# Patient Record
Sex: Female | Born: 1975 | Race: Black or African American | Hispanic: No | Marital: Single | Smoking: Former smoker
Health system: Southern US, Community
[De-identification: ages and names within clinical notes are randomized; demographics above are authoritative.]

## PROBLEM LIST (undated history)

## (undated) DIAGNOSIS — G43409 Hemiplegic migraine, not intractable, without status migrainosus: Secondary | ICD-10-CM

## (undated) DIAGNOSIS — F419 Anxiety disorder, unspecified: Secondary | ICD-10-CM

## (undated) DIAGNOSIS — A6000 Herpesviral infection of urogenital system, unspecified: Secondary | ICD-10-CM

## (undated) DIAGNOSIS — F32A Depression, unspecified: Secondary | ICD-10-CM

## (undated) DIAGNOSIS — F329 Major depressive disorder, single episode, unspecified: Secondary | ICD-10-CM

## (undated) DIAGNOSIS — A64 Unspecified sexually transmitted disease: Secondary | ICD-10-CM

## (undated) DIAGNOSIS — A5901 Trichomonal vulvovaginitis: Secondary | ICD-10-CM

## (undated) DIAGNOSIS — I10 Essential (primary) hypertension: Secondary | ICD-10-CM

## (undated) HISTORY — PX: TUBAL LIGATION: SHX77

## (undated) HISTORY — DX: Anxiety disorder, unspecified: F41.9

## (undated) HISTORY — DX: Depression, unspecified: F32.A

## (undated) HISTORY — DX: Herpesviral infection of urogenital system, unspecified: A60.00

## (undated) HISTORY — DX: Hemiplegic migraine, not intractable, without status migrainosus: G43.409

## (undated) HISTORY — DX: Major depressive disorder, single episode, unspecified: F32.9

## (undated) HISTORY — DX: Unspecified sexually transmitted disease: A64

## (undated) HISTORY — PX: MOUTH SURGERY: SHX715

## (undated) HISTORY — DX: Trichomonal vulvovaginitis: A59.01

---

## 2015-08-08 ENCOUNTER — Encounter: Payer: Self-pay | Admitting: Obstetrics & Gynecology

## 2015-08-08 ENCOUNTER — Ambulatory Visit (INDEPENDENT_AMBULATORY_CARE_PROVIDER_SITE_OTHER): Payer: Medicaid Other | Admitting: Obstetrics & Gynecology

## 2015-08-08 VITALS — BP 131/75 | HR 70 | Ht 67.0 in | Wt 195.8 lb

## 2015-08-08 DIAGNOSIS — Z3009 Encounter for other general counseling and advice on contraception: Secondary | ICD-10-CM

## 2015-08-08 NOTE — Progress Notes (Signed)
   CLINIC ENCOUNTER NOTE  History:  39 y.o. [id number] (SVD x 3) here today for sterilization consult. She denies any abnormal vaginal discharge, bleeding, pelvic pain or other concerns.   History reviewed. No pertinent past medical history.  History reviewed. No pertinent past surgical history.  The following portions of the patient's history were reviewed and updated as appropriate: allergies, current medications, past family history, past medical history, past social history, past surgical history and problem list.   Review of Systems:  Pertinent items noted in HPI and remainder of comprehensive ROS otherwise negative.  Objective:  Physical Exam BP 131/75 mmHg  Pulse 70  Ht 5\' 7"  (1.702 m)  Wt 195 lb 12.8 oz (88.814 kg)  BMI 30.66 kg/m2  LMP 08/07/2015 (Exact Date) CONSTITUTIONAL: Well-developed, well-nourished female in no acute distress.  HENT:  Normocephalic, atraumatic. External right and left ear normal. Oropharynx is clear and moist EYES: Conjunctivae and EOM are normal. Pupils are equal, round, and reactive to light. No scleral icterus.  NECK: Normal range of motion, supple, no masses SKIN: Skin is warm and dry. No rash noted. Not diaphoretic. No erythema. No pallor. NEUROLGIC: Alert and oriented to person, place, and time. Normal reflexes, muscle tone coordination. No cranial nerve deficit noted. PSYCHIATRIC: Normal mood and affect. Normal behavior. Normal judgment and thought content. CARDIOVASCULAR: Normal heart rate noted RESPIRATORY: Effort and breath sounds normal, no problems with respiration noted ABDOMEN: Soft, no distention noted.   PELVIC: Deferred MUSCULOSKELETAL: Normal range of motion. No edema noted.    Assessment & Plan:  Consultation for female sterilization Patient desires permanent sterilization.  Other reversible forms of contraception (over the counter/barrier methods; hormonal contraceptives including pill, patch, ring, Depo-Provera injection,  Nexplanon implant; hormonal IUDs Skyla and Mirena; nonhormonal copper IUD Paragard) were discussed with patient; she declined all these modalities. Also discussed the option of vasectomy for her female partner; she also declined this option. Recommended laparoscopic bilateral tubal sterilization using Filshie clips.  Failure risk of 1-2 % with increased risk of ectopic gestation if pregnancy occurs was also discussed with patient.  Reiterated permanence and irreversibility of this procedure, attempts to reverse tubal sterilization are often not successful.  Also emphasized risk of regret which is noted more in patients less than the age of 67[age].  Other risks of the procedure were discussed with patient including but not limited to: bleeding, infection, injury to surrounding organs and need for additional procedures.  All questions were answered.  Patient verbalized understanding of these risks and wants to proceed with this procedure.  She was told that she will be contacted by our surgical scheduler regarding the time and date of her surgery; routine preoperative instructions of having nothing to eat or drink after midnight on the day prior to surgery and also coming to the hospital 1.5 hours prior to her time of surgery were also emphasized.  She was told she may be called for a preoperative appointment about a week prior to surgery and will be given further preoperative instructions at that visit. Printed patient education handouts about the procedure were given to the patient to review at home. Medicaid papers were signed today.   Total face-to-face time with patient: 20  minutes. Over 50% of encounter was spent on counseling and coordination of care.   Jaynie CollinsUGONNA  Raelie Lohr, MD, FACOG Attending Obstetrician & Gynecologist, [facility]

## 2015-08-08 NOTE — Patient Instructions (Signed)
Laparoscopic Tubal Ligation Laparoscopic tubal ligation is a procedure that closes the fallopian tubes at a time other than right after childbirth. When the fallopian tubes are closed, the eggs that are released from the ovaries cannot enter the uterus, and sperm cannot reach the egg. Tubal ligation is also known as getting your "tubes tied." Tubal ligation is done so you will not be able to get pregnant or have a baby. Although this procedure may be undone (reversed), it should be considered permanent and irreversible. If you want to have future pregnancies, you should not have this procedure. LET YOUR HEALTH CARE PROVIDER KNOW ABOUT:  Any allergies you have.  All medicines you are taking, including vitamins, herbs, eye drops, creams, and over-the-counter medicines. This includes any use of steroids, either by mouth or in cream form.  Previous problems you or members of your family have had with the use of anesthetics.  Any blood disorders you have.  Previous surgeries you have had.  Any medical conditions you may have.  Possibility of pregnancy, if this applies.  Any past pregnancies. RISKS AND COMPLICATIONS  Infection.  Bleeding.  Injury to surrounding organs.  Side effects from anesthetics.  Failure of the procedure.  Ectopic pregnancy.  Future regret about having the procedure done. BEFORE THE PROCEDURE  Ask your health care provider about:  Changing or stopping your regular medicines. This is especially important if you are taking diabetes medicines or blood thinners.  Taking medicines such as aspirin and ibuprofen. These medicines can thin your blood. Do not take these medicines before your procedure if your health care provider instructs you not to.  Follow instructions from your health care provider about eating and drinking restrictions.  Plan to have someone take you home after the procedure.  If you go home right after the procedure, plan to have someone  with you for 24 hours. PROCEDURE  You will be given one or more of the following:  A medicine that helps you relax (sedative).  A medicine that numbs the area (local anesthetic).  A medicine that makes you fall asleep (general anesthetic).  A medicine that is injected into an area of your body that numbs everything below the injection site (regional anesthetic).  If you have been given general anesthetic, a tube will be put down your throat to help you breathe.  Two small cuts (incisions) will be made in the lower abdominal area and near the belly button.  Your bladder may be emptied with a small tube (catheter).  Your abdomen will be inflated with a safe gas (carbon dioxide). This will help to give the surgeon room to operate and visualize, and it will help the surgeon to avoid other organs.  A thin, lighted tube (laparoscope) with a camera attached will be inserted into your abdomen through one of the incisions near the belly button. Other small instruments will be inserted through the other abdominal incision.  The fallopian tubes will be tied off or burned (cauterized), or they will be blocked with a clip, ring, or clamp. In many cases, a small portion in the center of each fallopian tube will also be removed.  After the fallopian tubes are blocked, the gas will be released from the abdomen.  The incisions will be closed with stitches (sutures).  A bandage (dressing) will be placed over the incisions. The procedure may vary among health care providers and hospitals. AFTER THE PROCEDURE  Your blood pressure, heart rate, breathing rate, and blood oxygen level   will be monitored often until the medicines you were given have worn off.  You will be given pain medicine as needed.  If you had general anesthetic, you may have some mild discomfort in your throat. This is from the breathing tube that was placed in your throat while you were sleeping.  You may experience discomfort in  the shoulder area from some trapped air between your liver and your diaphragm. This sensation is normal, and it will slowly go away on its own.  You will have some mild abdominal discomfort for 3--7 days.   This information is not intended to replace advice given to you by your health care provider. Make sure you discuss any questions you have with your health care provider.   Document Released: 11/24/2000 Document Revised: 01/02/2015 Document Reviewed: 11/29/2011 Elsevier Interactive Patient Education 2016 [employer]  

## 2015-08-09 ENCOUNTER — Encounter (HOSPITAL_COMMUNITY): Payer: Self-pay | Admitting: *Deleted

## 2015-08-09 ENCOUNTER — Encounter: Payer: Self-pay | Admitting: *Deleted

## 2015-09-13 ENCOUNTER — Encounter (HOSPITAL_COMMUNITY): Payer: Self-pay

## 2015-09-27 ENCOUNTER — Encounter (HOSPITAL_COMMUNITY)
Admission: RE | Disposition: A | Discharge status: Home / Self Care | Payer: Self-pay | Source: Ambulatory Visit | Attending: Obstetrics & Gynecology

## 2015-09-27 ENCOUNTER — Ambulatory Visit (HOSPITAL_COMMUNITY): Payer: Medicaid Other | Admitting: Anesthesiology

## 2015-09-27 ENCOUNTER — Ambulatory Visit (HOSPITAL_COMMUNITY)
Admission: RE | Admit: 2015-09-27 | Discharge: 2015-09-27 | Disposition: A | Discharge status: Home / Self Care | Payer: Medicaid Other | Source: Ambulatory Visit | Attending: Obstetrics & Gynecology | Admitting: Obstetrics & Gynecology

## 2015-09-27 ENCOUNTER — Encounter (HOSPITAL_COMMUNITY): Payer: Self-pay | Admitting: Obstetrics & Gynecology

## 2015-09-27 DIAGNOSIS — Z302 Encounter for sterilization: Secondary | ICD-10-CM | POA: Diagnosis not present

## 2015-09-27 HISTORY — PX: LAPAROSCOPIC TUBAL LIGATION: SHX1937

## 2015-09-27 HISTORY — DX: Essential (primary) hypertension: I10

## 2015-09-27 LAB — CBC
HEMATOCRIT: 37.9 % (ref 36.0–46.0)
HEMOGLOBIN: 12.8 g/dL (ref 12.0–15.0)
MCH: 31.4 pg (ref 26.0–34.0)
MCHC: 33.8 g/dL (ref 30.0–36.0)
MCV: 92.9 fL (ref 78.0–100.0)
Platelets: 274 10*3/uL (ref 150–400)
RBC: 4.08 MIL/uL (ref 3.87–5.11)
RDW: 13.4 % (ref 11.5–15.5)
WBC: 8 10*3/uL (ref 4.0–10.5)

## 2015-09-27 LAB — PREGNANCY, URINE: Preg Test, Ur: NEGATIVE

## 2015-09-27 SURGERY — LIGATION, FALLOPIAN TUBE, LAPAROSCOPIC
Anesthesia: General | Site: Abdomen | Laterality: Bilateral

## 2015-09-27 MED ORDER — LIDOCAINE HCL (CARDIAC) 20 MG/ML IV SOLN
INTRAVENOUS | Status: DC | PRN
  Administered 2015-09-27: 100 mg via INTRAVENOUS

## 2015-09-27 MED ORDER — BUPIVACAINE HCL (PF) 0.5 % IJ SOLN
INTRAMUSCULAR | Status: DC | PRN
  Administered 2015-09-27: 20 mL
  Administered 2015-09-27: 10 mL

## 2015-09-27 MED ORDER — LACTATED RINGERS IV SOLN
INTRAVENOUS | Status: DC
  Administered 2015-09-27 (×2): via INTRAVENOUS

## 2015-09-27 MED ORDER — NEOSTIGMINE METHYLSULFATE 10 MG/10ML IV SOLN
INTRAVENOUS | Status: AC
  Filled 2015-09-27: qty 1

## 2015-09-27 MED ORDER — MIDAZOLAM HCL 2 MG/2ML IJ SOLN
INTRAMUSCULAR | Status: DC | PRN
  Administered 2015-09-27: 2 mg via INTRAVENOUS

## 2015-09-27 MED ORDER — KETOROLAC TROMETHAMINE 30 MG/ML IJ SOLN
INTRAMUSCULAR | Status: DC | PRN
  Administered 2015-09-27: 30 mg via INTRAVENOUS

## 2015-09-27 MED ORDER — PROPOFOL 10 MG/ML IV BOLUS
INTRAVENOUS | Status: DC | PRN
  Administered 2015-09-27: 180 mg via INTRAVENOUS

## 2015-09-27 MED ORDER — FENTANYL CITRATE (PF) 100 MCG/2ML IJ SOLN
INTRAMUSCULAR | Status: DC | PRN
  Administered 2015-09-27: 100 ug via INTRAVENOUS

## 2015-09-27 MED ORDER — NEOSTIGMINE METHYLSULFATE 10 MG/10ML IV SOLN
INTRAVENOUS | Status: DC | PRN
  Administered 2015-09-27: 3 mg via INTRAVENOUS

## 2015-09-27 MED ORDER — CITRIC ACID-SODIUM CITRATE 334-500 MG/5ML PO SOLN
30.0000 mL | ORAL | Status: AC
  Administered 2015-09-27: 30 mL via ORAL

## 2015-09-27 MED ORDER — GLYCOPYRROLATE 0.2 MG/ML IJ SOLN
INTRAMUSCULAR | Status: DC | PRN
  Administered 2015-09-27: 0.4 mg via INTRAVENOUS

## 2015-09-27 MED ORDER — ACETAMINOPHEN 325 MG PO TABS: 325.0000 mg | ORAL_TABLET | ORAL | Status: DC | PRN

## 2015-09-27 MED ORDER — SCOPOLAMINE 1 MG/3DAYS TD PT72
MEDICATED_PATCH | TRANSDERMAL | Status: AC
  Administered 2015-09-27: 1.5 mg via TRANSDERMAL
  Filled 2015-09-27: qty 1

## 2015-09-27 MED ORDER — GLYCOPYRROLATE 0.2 MG/ML IJ SOLN
INTRAMUSCULAR | Status: AC
  Filled 2015-09-27: qty 3

## 2015-09-27 MED ORDER — FENTANYL CITRATE (PF) 250 MCG/5ML IJ SOLN
INTRAMUSCULAR | Status: AC
  Filled 2015-09-27: qty 5

## 2015-09-27 MED ORDER — ROCURONIUM BROMIDE 100 MG/10ML IV SOLN
INTRAVENOUS | Status: DC | PRN
  Administered 2015-09-27: 30 mg via INTRAVENOUS

## 2015-09-27 MED ORDER — ONDANSETRON HCL 4 MG/2ML IJ SOLN
INTRAMUSCULAR | Status: AC
  Filled 2015-09-27: qty 2

## 2015-09-27 MED ORDER — ACETAMINOPHEN 160 MG/5ML PO SOLN: 325.0000 mg | ORAL | Status: DC | PRN

## 2015-09-27 MED ORDER — KETOROLAC TROMETHAMINE 30 MG/ML IJ SOLN
INTRAMUSCULAR | Status: AC
  Filled 2015-09-27: qty 1

## 2015-09-27 MED ORDER — BUPIVACAINE HCL (PF) 0.5 % IJ SOLN
INTRAMUSCULAR | Status: AC
  Filled 2015-09-27: qty 30

## 2015-09-27 MED ORDER — DEXAMETHASONE SODIUM PHOSPHATE 4 MG/ML IJ SOLN
INTRAMUSCULAR | Status: AC
  Filled 2015-09-27: qty 1

## 2015-09-27 MED ORDER — LACTATED RINGERS IV SOLN: INTRAVENOUS | Status: DC

## 2015-09-27 MED ORDER — LIDOCAINE HCL (CARDIAC) 20 MG/ML IV SOLN
INTRAVENOUS | Status: AC
  Filled 2015-09-27: qty 5

## 2015-09-27 MED ORDER — MIDAZOLAM HCL 2 MG/2ML IJ SOLN
INTRAMUSCULAR | Status: AC
  Filled 2015-09-27: qty 2

## 2015-09-27 MED ORDER — DOCUSATE SODIUM 100 MG PO CAPS: 100.0000 mg | ORAL_CAPSULE | Freq: Two times a day (BID) | ORAL | Status: DC | PRN

## 2015-09-27 MED ORDER — CITRIC ACID-SODIUM CITRATE 334-500 MG/5ML PO SOLN
ORAL | Status: AC
  Administered 2015-09-27: 30 mL via ORAL
  Filled 2015-09-27: qty 15

## 2015-09-27 MED ORDER — ONDANSETRON HCL 4 MG/2ML IJ SOLN
INTRAMUSCULAR | Status: DC | PRN
  Administered 2015-09-27: 4 mg via INTRAVENOUS

## 2015-09-27 MED ORDER — IBUPROFEN 600 MG PO TABS: 600.0000 mg | ORAL_TABLET | Freq: Four times a day (QID) | ORAL | Status: DC | PRN

## 2015-09-27 MED ORDER — OXYCODONE-ACETAMINOPHEN 5-325 MG PO TABS: 1.0000 | ORAL_TABLET | Freq: Four times a day (QID) | ORAL | Status: DC | PRN

## 2015-09-27 MED ORDER — PROPOFOL 10 MG/ML IV BOLUS
INTRAVENOUS | Status: AC
  Filled 2015-09-27: qty 20

## 2015-09-27 MED ORDER — OXYCODONE HCL 5 MG PO TABS: 5.0000 mg | ORAL_TABLET | Freq: Once | ORAL | Status: DC | PRN

## 2015-09-27 MED ORDER — ROCURONIUM BROMIDE 100 MG/10ML IV SOLN
INTRAVENOUS | Status: AC
  Filled 2015-09-27: qty 1

## 2015-09-27 MED ORDER — SCOPOLAMINE 1 MG/3DAYS TD PT72
1.0000 | MEDICATED_PATCH | Freq: Once | TRANSDERMAL | Status: DC
  Administered 2015-09-27: 1.5 mg via TRANSDERMAL

## 2015-09-27 MED ORDER — FENTANYL CITRATE (PF) 100 MCG/2ML IJ SOLN: 25.0000 ug | INTRAMUSCULAR | Status: DC | PRN

## 2015-09-27 MED ORDER — DEXAMETHASONE SODIUM PHOSPHATE 10 MG/ML IJ SOLN
INTRAMUSCULAR | Status: DC | PRN
  Administered 2015-09-27: 4 mg via INTRAVENOUS

## 2015-09-27 MED ORDER — OXYCODONE HCL 5 MG/5ML PO SOLN: 5.0000 mg | Freq: Once | ORAL | Status: DC | PRN

## 2015-09-27 SURGICAL SUPPLY — 21 items
CATH ROBINSON RED A/P 16FR (CATHETERS) ×2 IMPLANT
CLIP FILSHIE TUBAL LIGA STRL (Clip) ×2 IMPLANT
CLOTH BEACON ORANGE TIMEOUT ST (SAFETY) ×2 IMPLANT
DRSG COVADERM PLUS 2X2 (GAUZE/BANDAGES/DRESSINGS) IMPLANT
DRSG OPSITE POSTOP 3X4 (GAUZE/BANDAGES/DRESSINGS) ×2 IMPLANT
DURAPREP 26ML APPLICATOR (WOUND CARE) ×2 IMPLANT
GLOVE BIOGEL PI IND STRL 7.0 (GLOVE) ×3 IMPLANT
GLOVE BIOGEL PI INDICATOR 7.0 (GLOVE) ×3
GLOVE ECLIPSE 7.0 STRL STRAW (GLOVE) ×2 IMPLANT
GOWN STRL REUS W/TWL LRG LVL3 (GOWN DISPOSABLE) ×6 IMPLANT
LIQUID BAND (GAUZE/BANDAGES/DRESSINGS) ×2 IMPLANT
NEEDLE HYPO 22GX1.5 SAFETY (NEEDLE) ×2 IMPLANT
PACK LAPAROSCOPY BASIN (CUSTOM PROCEDURE TRAY) ×2 IMPLANT
PAD TRENDELENBURG POSITION (MISCELLANEOUS) ×2 IMPLANT
SLEEVE XCEL OPT CAN 5 100 (ENDOMECHANICALS) IMPLANT
SUT VIC AB 3-0 X1 27 (SUTURE) ×2 IMPLANT
SUT VICRYL 0 UR6 27IN ABS (SUTURE) ×2 IMPLANT
SYR 30ML LL (SYRINGE) IMPLANT
TOWEL OR 17X24 6PK STRL BLUE (TOWEL DISPOSABLE) ×4 IMPLANT
TROCAR XCEL NON-BLD 11X100MML (ENDOMECHANICALS) ×2 IMPLANT
TROCAR XCEL NON-BLD 5MMX100MML (ENDOMECHANICALS) IMPLANT

## 2015-09-27 NOTE — Anesthesia Preprocedure Evaluation (Addendum)
Anesthesia Evaluation  Patient identified by MRN, date of birth, ID band Patient awake    Reviewed: Allergy & Precautions, NPO status , Patient's Chart, lab work & pertinent test results  History of Anesthesia Complications Negative for: history of anesthetic complications  Airway Mallampati: I  TM Distance: >3 FB Neck ROM: Full    Dental  (+) Teeth Intact   Pulmonary neg shortness of breath, neg sleep apnea, neg COPD, neg recent URI, Current Smoker,    breath sounds clear to auscultation       Cardiovascular hypertension, negative cardio ROS   Rhythm:Regular     Neuro/Psych negative neurological ROS  negative psych ROS   GI/Hepatic negative GI ROS, Neg liver ROS,   Endo/Other  negative endocrine ROS  Renal/GU negative Renal ROS     Musculoskeletal negative musculoskeletal ROS (+)   Abdominal   Peds  Hematology negative hematology ROS (+)   Anesthesia Other Findings   Reproductive/Obstetrics                            Anesthesia Physical Anesthesia Plan  ASA: II  Anesthesia Plan: General   Post-op Pain Management:    Induction: Intravenous  Airway Management Planned: Oral ETT  Additional Equipment: None  Intra-op Plan:   Post-operative Plan: Extubation in OR  Informed Consent: I have reviewed the patients History and Physical, chart, labs and discussed the procedure including the risks, benefits and alternatives for the proposed anesthesia with the patient or authorized representative who has indicated his/her understanding and acceptance.   Dental advisory given  Plan Discussed with: CRNA and Surgeon  Anesthesia Plan Comments:         Anesthesia Quick Evaluation

## 2015-09-27 NOTE — Anesthesia Postprocedure Evaluation (Signed)
Anesthesia Post Note  Patient: Robyn Ramirez  Procedure(s) Performed: Procedure(s) (LRB): LAPAROSCOPIC TUBAL LIGATION with FILSHE CLIPS (Bilateral)  Patient location during evaluation: PACU Anesthesia Type: General Level of consciousness: awake Pain management: pain level controlled Vital Signs Assessment: post-procedure vital signs reviewed and stable Respiratory status: spontaneous breathing Cardiovascular status: stable Postop Assessment: no signs of nausea or vomiting Anesthetic complications: no    Last Vitals:  Filed Vitals:   09/27/15 1430 09/27/15 1445  BP: 125/85 129/82  Pulse: 58 63  Temp:    Resp: 9 16    Last Pain: There were no vitals filed for this visit.               Angelyn Osterberg JR,JOHN Susann Givens

## 2015-09-27 NOTE — Transfer of Care (Signed)
Immediate Anesthesia Transfer of Care Note  Patient: Robyn Ramirez  Procedure(s) Performed: Procedure(s): LAPAROSCOPIC TUBAL LIGATION with FILSHE CLIPS (Bilateral)  Patient Location: PACU  Anesthesia Type:General  Level of Consciousness: awake, alert  and oriented  Airway & Oxygen Therapy: Patient Spontanous Breathing and Patient connected to nasal cannula oxygen  Post-op Assessment: Report given to RN and Post -op Vital signs reviewed and stable  Post vital signs: Reviewed and stable  Last Vitals:  Filed Vitals:   09/27/15 1125  BP: 118/83  Pulse: 73  Temp: 37.1 C  Resp: 16    Complications: No apparent anesthesia complications

## 2015-09-27 NOTE — Discharge Instructions (Signed)
Laparoscopic Tubal Ligation, Care After °Refer to this sheet in the next few weeks. These instructions provide you with information about caring for yourself after your procedure. Your health care provider may also give you more specific instructions. Your treatment has been planned according to current medical practices, but problems sometimes occur. Call your health care provider if you have any problems or questions after your procedure. °WHAT TO EXPECT AFTER THE PROCEDURE °After your procedure, it is common to have: °· Sore throat. °· Soreness at the incision site. °· Mild cramping. °· Tiredness. °· Mild nausea or vomiting. °· Shoulder pain. °HOME CARE INSTRUCTIONS °· Rest for the remainder of the day. °· Take medicines only as directed by your health care provider. These include over-the-counter medicines and prescription medicines. Do not take aspirin, which can cause bleeding. °· Over the next few days, gradually return to your normal activities and your normal diet. °· Avoid sexual intercourse for 2 weeks or as directed by your health care provider. °· Do not use tampons, and do not douche. °· Do not drive or operate heavy machinery while taking pain medicine. °· Do not lift anything that is heavier than 5 lb (2.3 kg) for 2 weeks or as directed by your health care provider. °· Do not take baths. Take showers only. Ask your health care provider when you can start taking baths. °· Take your temperature twice each day and write it down. °· Try to have help for your household needs for the first 7-10 days. °· There are many different ways to close and cover an incision, including stitches (sutures), skin glue, and adhesive strips. Follow instructions from your health care provider about: °¨ Incision care. °¨ Bandage (dressing) changes and removal. °¨ Incision closure removal. °· Check your incision area every day for signs of infection. Watch for: °¨ Redness, swelling, or pain. °¨ Fluid, blood, or pus. °· Keep  all follow-up visits as directed by your health care provider. °SEEK MEDICAL CARE IF: °· You have redness, swelling, or increasing pain in your incision area. °· You have fluid, blood, or pus coming from your incision for longer than 1 day. °· You notice a bad smell coming from your incision or your dressing. °· The edges of your incision break open after the sutures have been removed. °· Your pain does not decrease after 2-3 days. °· You have a rash. °· You repeatedly become dizzy or light-headed. °· You have a reaction to your medicine. °· Your pain medicine is not helping. °· You are constipated. °SEEK IMMEDIATE MEDICAL CARE IF: °· You have a fever. °· You faint. °· You have increasing pain in your abdomen. °· You have severe pain in one or both of your shoulders. °· You have bleeding or drainage from your suture sites or your vagina after surgery. °· You have shortness of breath or have difficulty breathing. °· You have chest pain or leg pain. °· You have ongoing nausea, vomiting, or diarrhea. °  °This information is not intended to replace advice given to you by your health care provider. Make sure you discuss any questions you have with your health care provider. °  °Document Released: 03/07/2005 Document Revised: 01/02/2015 Document Reviewed: 11/29/2011 °Elsevier Interactive Patient Education ©2016 Elsevier Inc. ° ° °Post Anesthesia Home Care Instructions ° °Activity: °Get plenty of rest for the remainder of the day. A responsible adult should stay with you for 24 hours following the procedure.  °For the next 24 hours, DO NOT: °-Drive   a car °-Operate machinery °-Drink alcoholic beverages °-Take any medication unless instructed by your physician °-Make any legal decisions or sign important papers. ° °Meals: °Start with liquid foods such as gelatin or soup. Progress to regular foods as tolerated. Avoid greasy, spicy, heavy foods. If nausea and/or vomiting occur, drink only clear liquids until the nausea and/or  vomiting subsides. Call your physician if vomiting continues. ° °Special Instructions/Symptoms: °Your throat may feel dry or sore from the anesthesia or the breathing tube placed in your throat during surgery. If this causes discomfort, gargle with warm salt water. The discomfort should disappear within 24 hours. ° °If you had a scopolamine patch placed behind your ear for the management of post- operative nausea and/or vomiting: ° °1. The medication in the patch is effective for 72 hours, after which it should be removed.  Wrap patch in a tissue and discard in the trash. Wash hands thoroughly with soap and water. °2. You may remove the patch earlier than 72 hours if you experience unpleasant side effects which may include dry mouth, dizziness or visual disturbances. °3. Avoid touching the patch. Wash your hands with soap and water after contact with the patch. ° ° °

## 2015-09-27 NOTE — Anesthesia Procedure Notes (Signed)
Procedure Name: Intubation Date/Time: 09/27/2015 12:51 PM Performed by: Elgie Congo Pre-anesthesia Checklist: Patient identified, Patient being monitored, Emergency Drugs available and Suction available Patient Re-evaluated:Patient Re-evaluated prior to inductionOxygen Delivery Method: Circle system utilized Preoxygenation: Pre-oxygenation with 100% oxygen Intubation Type: IV induction Ventilation: Mask ventilation without difficulty Laryngoscope Size: Mac and 3 Grade View: Grade I Tube type: Oral Tube size: 7.0 mm Number of attempts: 1 Airway Equipment and Method: Stylet Placement Confirmation: ETT inserted through vocal cords under direct vision,  breath sounds checked- equal and bilateral and positive ETCO2 Secured at: 21 cm Tube secured with: Tape Dental Injury: Teeth and Oropharynx as per pre-operative assessment

## 2015-09-27 NOTE — Op Note (Signed)
Robyn Ramirez 09/27/2015  PREOPERATIVE DIAGNOSIS:  Undesired fertility  POSTOPERATIVE DIAGNOSIS:  Undesired fertility  PROCEDURE:  Laparoscopic Bilateral Tubal Sterilization using Filshie Clips   SURGEON: Jaynie Collins, MD  ANESTHESIA:  General endotracheal  COMPLICATIONS:  None immediate.  ESTIMATED BLOOD LOSS:  2 ml.  FLUIDS: 1300 ml LR.  INDICATIONS: 40 y.o. [id number]  with undesired fertility, desires permanent sterilization. Other reversible forms of contraception were discussed with patient; she declines all other modalities.  Risks of procedure discussed with patient including permanence of method, bleeding, infection, injury to surrounding organs and need for additional procedures including laparotomy, risk of regret.  Failure risk of 0.5-1% with increased risk of ectopic gestation if pregnancy occurs was also discussed with patient.      FINDINGS:  Normal uterus, tubes, and ovaries.  TECHNIQUE:  The patient was taken to the operating room where general anesthesia was obtained without difficulty.  She was then placed in the dorsal lithotomy position and prepared and draped in sterile fashion.  After an adequate timeout was performed, a bivalved speculum was then placed in the patient's vagina, and the anterior lip of cervix grasped with the single-tooth tenaculum.  The uterine manipulator was then advanced into the uterus.  The speculum was removed from the vagina.  Attention was then turned to the patient's abdomen where a 11-mm skin incision was made in the umbilical fold.  The Optiview 11-mm trocar and sleeve were then advanced without difficulty with the laparoscope under direct visualization into the abdomen.  The abdomen was then insufflated with carbon dioxide gas and adequate pneumoperitoneum was obtained.  A survey of the patient's pelvis and abdomen revealed entirely normal anatomy.  The fallopian tubes were observed and found to be normal in appearance. The Filshie clip  applicator was placed through the operative port, and a Filshie clip was placed on the right fallopian tube ,about 2 cm from the cornual attachment, with care given to incorporate the underlying mesosalpinx.  A similar process was carried out on the contralateral side allowing for bilateral tubal sterilization.   Good hemostasis was noted overall.  Local analgesia was drizzled on both operative sites.The instruments were then removed from the patient's abdomen and the fascial incision was repaired with 0 Vicryl, and the skin was closed with Dermabond.  The uterine manipulator and the tenaculum were removed from the vagina without complications. The patient tolerated the procedure well.  Sponge, lap, and needle counts were correct times two.  The patient was then taken to the recovery room awake, extubated and in stable condition.  The patient will be discharged to home as per PACU criteria.  Routine postoperative instructions given.  She was prescribed Percocet, Ibuprofen and Colace.  She will follow up in the clinic in 4 weeks for postoperative evaluation.   Jaynie Collins, MD, FACOG Attending Obstetrician & Gynecologist, [facility] and Center for [facility]

## 2015-09-27 NOTE — H&P (Signed)
Preoperative History and Physical  Robyn Ramirez is a 40 y.o. [id number] here for sterilization surgery.   No significant preoperative concerns.  Proposed surgery: Laparoscopic bilateral tubal sterilization using Filshie clips  Past Medical History  Diagnosis Date  . Hypertension     history of, no medication, resolved with weight loss   Past Surgical History  Procedure Laterality Date  . Mouth surgery     OB History  Gravida Para Term Preterm AB SAB TAB Ectopic Multiple Living  # Outcome Date GA Lbr Len/2nd Weight Sex Delivery Anes PTL Lv  8 TAB           7 TAB           6 TAB           5 TAB           4 SAB           3 Term      Vag-Spont     2 Term      Vag-Spont     1 Term      Vag-Spont       Patient denies any other pertinent gynecologic issues.   No current facility-administered medications on file prior to encounter.   Current Outpatient Prescriptions on File Prior to Encounter  Medication Sig Dispense Refill  . norethindrone-ethinyl estradiol (OVCON-35,BALZIVA,BRIELLYN) 0.4-35 MG-MCG tablet Take 1 tablet by mouth daily.     No Known Allergies  Social History:   reports that she has been smoking Cigarettes.  She has a 2 pack-year smoking history. She has never used smokeless tobacco. She reports that she drinks alcohol. She reports that she does not use illicit drugs.  History reviewed. No pertinent family history.  Review of Systems: Noncontributory  PHYSICAL EXAM: Blood pressure 118/83, pulse 73, temperature 98.7 F (37.1 C), temperature source Oral, resp. rate 16, height  (1.702 m), weight 189 lb 8 oz (85.957 kg), last menstrual period 09/08/2015, SpO2 100 %. CONSTITUTIONAL: Well-developed, well-nourished female in no acute distress.  HENT:  Normocephalic, atraumatic, External right and left ear normal. Oropharynx is clear and moist EYES: Conjunctivae and EOM are normal. Pupils are equal, round, and reactive to light. No scleral  icterus.  NECK: Normal range of motion, supple, no masses SKIN: Skin is warm and dry. No rash noted. Not diaphoretic. No erythema. No pallor. NEUROLGIC: Alert and oriented to person, place, and time. Normal reflexes, muscle tone coordination. No cranial nerve deficit noted. PSYCHIATRIC: Normal mood and affect. Normal behavior. Normal judgment and thought content. CARDIOVASCULAR: Normal heart rate noted, regular rhythm RESPIRATORY: Effort and breath sounds normal, no problems with respiration noted ABDOMEN: Soft, nontender, nondistended. PELVIC: Deferred MUSCULOSKELETAL: Normal range of motion. No edema and no tenderness. 2+ distal pulses.  Labs: Results for orders placed or performed during the hospital encounter of 09/27/15 (from the past 336 hour(s))  CBC   Collection Time: 09/27/15 11:10 AM  Result Value Ref Range   WBC 8.0 4.0 - 10.5 K/uL   RBC 4.08 3.87 - 5.11 MIL/uL   Hemoglobin 12.8 12.0 - 15.0 g/dL   HCT 45.4 09.8 - 11.9 %   MCV 92.9 78.0 - 100.0 fL   MCH 31.4 26.0 - 34.0 pg   MCHC 33.8 30.0 - 36.0 g/dL   RDW 14.7 82.9 - 56.2 %   Platelets 274 150 - 400 K/uL  Pregnancy, urine   Collection Time:  09/27/15 11:15 AM  Result Value Ref Range   Preg Test, Ur NEGATIVE NEGATIVE    Assessment: Patient Active Problem List   Diagnosis Date Noted  . Encounter for sterilization 09/27/2015    Plan: Patient will undergo laparoscopic bilateral tubal sterilization using Filshie clips for permanent sterilization.  Other reversible forms of contraception were discussed with patient; she declines all other modalities. Risks of procedure discussed with patient including but not limited to: risk of regret, permanence of method, bleeding, infection, injury to surrounding organs and need for additional procedures including laparotomy, surgical site problems and other postoperative/anesthesia complications.  Failure risk of 1-2 % with increased risk of ectopic gestation if pregnancy occurs was  also discussed with patient.  Patient verbalized understanding of these risks and wants to proceed with sterilization.  The patient concurred with the proposed plan, giving informed written consent for the surgery.  Routine postoperative instructions will be reviewed with the patient and her family in detail after surgery.  Patient has been NPO since last night she will remain NPO for procedure.  Anesthesia and OR aware.  To OR when ready.   Jaynie Collins, M.D. 09/27/2015 12:28 PM

## 2015-09-28 ENCOUNTER — Encounter (HOSPITAL_COMMUNITY): Payer: Self-pay | Admitting: Obstetrics & Gynecology

## 2015-10-25 ENCOUNTER — Ambulatory Visit: Payer: Medicaid Other | Admitting: Obstetrics & Gynecology

## 2015-11-12 ENCOUNTER — Ambulatory Visit: Payer: Medicaid Other | Admitting: Obstetrics & Gynecology

## 2015-11-12 ENCOUNTER — Encounter: Payer: Self-pay | Admitting: Obstetrics & Gynecology

## 2015-11-12 VITALS — BP 131/76 | HR 68 | Temp 98.4°F | Wt 193.9 lb

## 2015-11-12 DIAGNOSIS — Z09 Encounter for follow-up examination after completed treatment for conditions other than malignant neoplasm: Secondary | ICD-10-CM

## 2015-11-12 NOTE — Progress Notes (Signed)
  Subjective:     Robyn Ramirez is a 40 y.o. [phone] female who presents to the clinic 7 weMickel Croweks status post laparoscopic BTS for undesired fertility. Eating a regular diet without difficulty. Bowel movements are normal. The patient is not having any pain.  The following portions of the patient's history were reviewed and updated as appropriate: allergies, current medications, past family history, past medical history, past social history, past surgical history and problem list.  Review of Systems Pertinent items noted in HPI and remainder of comprehensive ROS otherwise negative.    Objective:    BP 131/76 mmHg  Pulse 68  Temp(Src) 98.4 F (36.9 C) (Oral)  Wt 193 lb 14.4 oz (87.952 kg)  LMP 10/26/2015 (Exact Date) General:  alert and no distress  Abdomen: soft, bowel sounds active, non-tender  Incision:   healing well, no drainage, no erythema, no hernia, no seroma, no swelling, no dehiscence, incision well approximated     Assessment:    Doing well postoperatively. Operative findings again reviewed. Pathology report discussed.    Plan:   1. Continue any current medications. 2. Wound care discussed. 3. Activity restrictions: none 4. Anticipated return to work: now. 5. Follow up as needed    Jaynie CollinsUGONNA  Sobia Karger, MD, FACOG Attending Obstetrician & Gynecologist, [facility]

## 2015-11-12 NOTE — Patient Instructions (Signed)
  Return to clinic for any scheduled appointments or for any gynecologic concerns as needed.   Thank you for enrolling in MyChart. Please follow the instructions below to securely access your online medical record. MyChart allows you to send messages to your doctor, view your test results, manage appointments, and more.   How Do I Sign Up? 1. In your Internet browser, go to [employer] Address Bar and enter [email or web address]. 2. Click on the Sign Up Now link in the Sign In box. You will see the New Member Sign Up page. 3. Enter your MyChart Access Code exactly as it appears below. You will not need to use this code after you've completed the sign-up process. If you do not sign up before the expiration date, you must request a new code.  MyChart Access Code: Activation code not generated Current MyChart Status: Patient Declined  4. Enter your Social Security Number ([id number]) and Date of Birth (mm/dd/yyyy) as indicated and click Submit. You will be taken to the next sign-up page. 5. Create a MyChart ID. This will be your MyChart login ID and cannot be changed, so think of one that is secure and easy to remember. 6. Create a MyChart password. You can change your password at any time. 7. Enter your Password Reset Question and Answer. This can be used at a later time if you forget your password.  8. Enter your e-mail address. You will receive e-mail notification when new information is available in MyChart. 9. Click Sign Up. You can now view your medical record.   Additional Information Remember, MyChart is NOT to be used for urgent needs. For medical emergencies, dial 911.

## 2016-12-04 ENCOUNTER — Ambulatory Visit: Payer: Medicaid Other | Admitting: Obstetrics & Gynecology

## 2016-12-08 ENCOUNTER — Ambulatory Visit (INDEPENDENT_AMBULATORY_CARE_PROVIDER_SITE_OTHER): Payer: 59 | Admitting: Obstetrics & Gynecology

## 2016-12-08 VITALS — BP 114/65 | HR 72 | Ht 67.0 in | Wt 198.8 lb

## 2016-12-08 DIAGNOSIS — R87611 Atypical squamous cells cannot exclude high grade squamous intraepithelial lesion on cytologic smear of cervix (ASC-H): Secondary | ICD-10-CM | POA: Insufficient documentation

## 2016-12-08 DIAGNOSIS — Z01419 Encounter for gynecological examination (general) (routine) without abnormal findings: Secondary | ICD-10-CM

## 2016-12-08 NOTE — Patient Instructions (Signed)
Preventive Care 18-39 Years, Female Preventive care refers to lifestyle choices and visits with your health care provider that can promote health and wellness. What does preventive care include?  A yearly physical exam. This is also called an annual well check.  Dental exams once or twice a year.  Routine eye exams. Ask your health care provider how often you should have your eyes checked.  Personal lifestyle choices, including:  Daily care of your teeth and gums.  Regular physical activity.  Eating a healthy diet.  Avoiding tobacco and drug use.  Limiting alcohol use.  Practicing safe sex.  Taking vitamin and mineral supplements as recommended by your health care provider. What happens during an annual well check? The services and screenings done by your health care provider during your annual well check will depend on your age, overall health, lifestyle risk factors, and family history of disease. Counseling  Your health care provider may ask you questions about your:  Alcohol use.  Tobacco use.  Drug use.  Emotional well-being.  Home and relationship well-being.  Sexual activity.  Eating habits.  Work and work environment.  Method of birth control.  Menstrual cycle.  Pregnancy history. Screening  You may have the following tests or measurements:  Height, weight, and BMI.  Diabetes screening. This is done by checking your blood sugar (glucose) after you have not eaten for a while (fasting).  Blood pressure.  Lipid and cholesterol levels. These may be checked every 5 years starting at age 20.  Skin check.  Hepatitis C blood test.  Hepatitis B blood test.  Sexually transmitted disease (STD) testing.  BRCA-related cancer screening. This may be done if you have a family history of breast, ovarian, tubal, or peritoneal cancers.  Pelvic exam and Pap test. This may be done every 3 years starting at age 21. Starting at age 30, this may be done every 5  years if you have a Pap test in combination with an HPV test. Discuss your test results, treatment options, and if necessary, the need for more tests with your health care provider. Vaccines  Your health care provider may recommend certain vaccines, such as:  Influenza vaccine. This is recommended every year.  Tetanus, diphtheria, and acellular pertussis (Tdap, Td) vaccine. You may need a Td booster every 10 years.  Varicella vaccine. You may need this if you have not been vaccinated.  HPV vaccine. If you are 26 or younger, you may need three doses over 6 months.  Measles, mumps, and rubella (MMR) vaccine. You may need at least one dose of MMR. You may also need a second dose.  Pneumococcal 13-valent conjugate (PCV13) vaccine. You may need this if you have certain conditions and were not previously vaccinated.  Pneumococcal polysaccharide (PPSV23) vaccine. You may need one or two doses if you smoke cigarettes or if you have certain conditions.  Meningococcal vaccine. One dose is recommended if you are age 19-21 years and a first-year college student living in a residence hall, or if you have one of several medical conditions. You may also need additional booster doses.  Hepatitis A vaccine. You may need this if you have certain conditions or if you travel or work in places where you may be exposed to hepatitis A.  Hepatitis B vaccine. You may need this if you have certain conditions or if you travel or work in places where you may be exposed to hepatitis B.  Haemophilus influenzae type b (Hib) vaccine. You may need this   if you have certain risk factors. Talk to your health care provider about which screenings and vaccines you need and how often you need them. This information is not intended to replace advice given to you by your health care provider. Make sure you discuss any questions you have with your health care provider. Document Released: 10/14/2001 Document Revised: 05/07/2016  Document Reviewed: 06/19/2015 Elsevier Interactive Patient Education  2017 [employer].

## 2016-12-08 NOTE — Progress Notes (Signed)
GYNECOLOGY ANNUAL PREVENTATIVE CARE ENCOUNTER NOTE  Subjective:   Robyn Ramirez is a 41 y.o. [phone] female here for a routine annual gynecologic exam.  Current complaints: none.   Denies abnormal vaginal bleeding, discharge, pelvic pain, problems with intercourse or other gynecologic concerns.    Gynecologic History No LMP recorded. Contraception: tubal ligation Last Pap: 2015. Results were: normal  Obstetric History OB History  Gravida Para Term Preterm AB Living  SAB TAB Ectopic Multiple Live Births  1 4          # Outcome Date GA Lbr Len/2nd Weight Sex Delivery Anes PTL Lv  8 TAB           7 TAB           6 TAB           5 TAB           4 SAB           3 Term      Vag-Spont     2 Term      Vag-Spont     1 Term      Vag-Spont         Past Medical History:  Diagnosis Date  . Hypertension    history of, no medication, resolved with weight loss    Past Surgical History:  Procedure Laterality Date  . LAPAROSCOPIC TUBAL LIGATION Bilateral 09/27/2015   Procedure: LAPAROSCOPIC TUBAL LIGATION with Anna Genre CLIPS;  Surgeon: Tereso Newcomer, MD;  Location: [facility];  Service: Gynecology;  Laterality: Bilateral;  . MOUTH SURGERY      Current Outpatient Prescriptions on File Prior to Visit  Medication Sig Dispense Refill  . docusate sodium (COLACE) 100 MG capsule Take 200 mg by mouth daily as needed for mild constipation. Reported on 11/12/2015    . docusate sodium (COLACE) 100 MG capsule Take 1 capsule (100 mg total) by mouth 2 (two) times daily as needed. 30 capsule 2  . Multiple Vitamin (MULTIVITAMIN WITH MINERALS) TABS tablet Take 1 tablet by mouth daily.     No current facility-administered medications on file prior to visit.     No Known Allergies  Social History   Social History  . Marital status: Legally Separated    Spouse name: N/A  . Number of children: N/A  . Years of education: N/A   Occupational History  . Not on file.   Social  History Main Topics  . Smoking status: Current Every Day Smoker    Packs/day: 0.10    Years: 20.00    Types: Cigarettes  . Smokeless tobacco: Never Used  . Alcohol use Yes     Comment: ocassional  . Drug use: No  . Sexual activity: Yes    Birth control/ protection: Pill   Other Topics Concern  . Not on file   Social History Narrative  . No narrative on file    No family history on file.  The following portions of the patient's history were reviewed and updated as appropriate: allergies, current medications, past family history, past medical history, past social history, past surgical history and problem list.  Review of Systems Pertinent items noted in HPI and remainder of comprehensive ROS otherwise negative.   Objective:  BP 114/65   Pulse 72   Ht  (1.702 m)   Wt 198 lb 12.8 oz (90.2 kg)   BMI 31.14 kg/m  CONSTITUTIONAL: Well-developed, well-nourished  female in no acute distress.  HENT:  Normocephalic, atraumatic, External right and left ear normal. Oropharynx is clear and moist EYES: Conjunctivae and EOM are normal. Pupils are equal, round, and reactive to light. No scleral icterus.  NECK: Normal range of motion, supple, no masses.  Normal thyroid.  SKIN: Skin is warm and dry. No rash noted. Not diaphoretic. No erythema. No pallor. NEUROLOGIC: Alert and oriented to person, place, and time. Normal reflexes, muscle tone coordination. No cranial nerve deficit noted. PSYCHIATRIC: Normal mood and affect. Normal behavior. Normal judgment and thought content. CARDIOVASCULAR: Normal heart rate noted, regular rhythm RESPIRATORY: Clear to auscultation bilaterally. Effort and breath sounds normal, no problems with respiration noted. BREASTS: Symmetric in size. No masses, skin changes, nipple drainage, or lymphadenopathy. ABDOMEN: Soft, normal bowel sounds, no distention noted.  No tenderness, rebound or guarding.  PELVIC: Normal appearing external genitalia; normal appearing  vaginal mucosa and cervix.  No abnormal discharge noted.  Pap smear obtained.  Normal uterine size, no other palpable masses, no uterine or adnexal tenderness. MUSCULOSKELETAL: Normal range of motion. No tenderness.  No cyanosis, clubbing, or edema.  2+ distal pulses.   Assessment:  Annual gynecologic examination with pap smear   Plan:  Will follow up results of pap smear and manage accordingly. Routine preventative health maintenance measures emphasized. Please refer to After Visit Summary for other counseling recommendations.    Jaynie Collins, MD, FACOG Attending Obstetrician & Gynecologist, [facility] and Center for [facility]'

## 2016-12-11 ENCOUNTER — Encounter: Payer: Self-pay | Admitting: Obstetrics & Gynecology

## 2016-12-11 LAB — CYTOLOGY - PAP
BACTERIAL VAGINITIS: NEGATIVE
CANDIDA VAGINITIS: NEGATIVE
Chlamydia: NEGATIVE
HPV (WINDOPATH): NOT DETECTED
Neisseria Gonorrhea: NEGATIVE
TRICH (WINDOWPATH): NEGATIVE

## 2016-12-16 ENCOUNTER — Telehealth: Payer: Self-pay | Admitting: General Practice

## 2016-12-16 NOTE — Telephone Encounter (Signed)
Per Dr Macon Large, patient needs colposcopy for ASCUS cannot rule out HGSIL pap. Called patient, no answer- left message to call us back concerning results. Patient needs appt for colpo.

## 2016-12-16 NOTE — Telephone Encounter (Signed)
Patient called into front office & I informed her of results & explained abnormal paps/colposcopy. Patient already has colpo appt scheduled. Patient verbalized understanding to all & had no questions

## 2017-01-13 ENCOUNTER — Ambulatory Visit (INDEPENDENT_AMBULATORY_CARE_PROVIDER_SITE_OTHER): Payer: 59 | Admitting: Obstetrics & Gynecology

## 2017-01-13 ENCOUNTER — Encounter: Payer: Self-pay | Admitting: Obstetrics & Gynecology

## 2017-01-13 ENCOUNTER — Other Ambulatory Visit (HOSPITAL_COMMUNITY)
Admission: RE | Admit: 2017-01-13 | Discharge: 2017-01-13 | Disposition: A | Discharge status: Home / Self Care | Payer: 59 | Source: Ambulatory Visit | Attending: Obstetrics & Gynecology | Admitting: Obstetrics & Gynecology

## 2017-01-13 VITALS — BP 125/87 | HR 95 | Ht 68.0 in | Wt 198.0 lb

## 2017-01-13 DIAGNOSIS — R87611 Atypical squamous cells cannot exclude high grade squamous intraepithelial lesion on cytologic smear of cervix (ASC-H): Secondary | ICD-10-CM | POA: Insufficient documentation

## 2017-01-13 DIAGNOSIS — Z3202 Encounter for pregnancy test, result negative: Secondary | ICD-10-CM

## 2017-01-13 LAB — POCT PREGNANCY, URINE: PREG TEST UR: NEGATIVE

## 2017-01-13 NOTE — Patient Instructions (Signed)
Colposcopy, Care After This sheet gives you information about how to care for yourself after your procedure. Your health care provider may also give you more specific instructions. If you have problems or questions, contact your health care provider. What can I expect after the procedure? If you had a colposcopy without a biopsy, you can expect to feel fine right away, but you may have some spotting for a few days. You can go back to your normal activities. If you had a colposcopy with a biopsy, it is common to have:  Soreness and pain. This may last for a few days.  Light-headedness.  Mild vaginal bleeding or dark-colored, grainy discharge. This may last for a few days. The discharge may be due to a solution that was used during the procedure. You may need to wear a sanitary pad during this time.  Spotting for at least 48 hours after the procedure. Follow these instructions at home:  Take over-the-counter and prescription medicines only as told by your health care provider. Talk with your health care provider about what type of over-the-counter pain medicine and prescription medicine you can start taking again. It is especially important to talk with your health care provider if you take blood-thinning medicine.  Do not drive or use heavy machinery while taking prescription pain medicine.  For at least 3 days after your procedure, or as long as told by your health care provider, avoid:  Douching.  Using tampons.  Having sexual intercourse.  Continue to use birth control (contraception).  Limit your physical activity for the first day after the procedure as told by your health care provider. Ask your health care provider what activities are safe for you.  It is up to you to get the results of your procedure. Ask your health care provider, or the department performing the procedure, when your results will be ready.  Keep all follow-up visits as told by your health care provider. This  is important. Contact a health care provider if:  You develop a skin rash. Get help right away if:  You are bleeding heavily from your vagina or you are passing blood clots. This includes using more than one sanitary pad per hour for 2 hours in a row.  You have a fever or chills.  You have pelvic pain.  You have abnormal, yellow-colored, or bad-smelling vaginal discharge. This could be a sign of infection.  You have severe pain or cramps in your lower abdomen that do not get better with medicine.  You feel light-headed or dizzy, or you faint. Summary  If you had a colposcopy without a biopsy, you can expect to feel fine right away, but you may have some spotting for a few days. You can go back to your normal activities.  If you had a colposcopy with a biopsy, you may notice mild pain and spotting for 48 hours after the procedure.  Avoid douching, using tampons, and having sexual intercourse for 3 days after the procedure or as long as told by your health care provider.  Contact your health care provider if you have bleeding, severe pain, or signs of infection. This information is not intended to replace advice given to you by your health care provider. Make sure you discuss any questions you have with your health care provider. Document Released: 06/08/2013 Document Revised: 04/04/2016 Document Reviewed: 04/04/2016 Elsevier Interactive Patient Education  2017 [employer].    Thank you for enrolling in [facility]. Please follow the instructions below to securely  access your online medical record. MyChart allows you to send messages to your doctor, view your test results, manage appointments, and more.   How Do I Sign Up? 1. In your Internet browser, go to [employer] Address Bar and enter [email or web address]. 2. Click on the Sign Up Now link in the Sign In box. You will see the New Member Sign Up page. 3. Enter your MyChart Access Code exactly as it appears below. You will not  need to use this code after you've completed the sign-up process. If you do not sign up before the expiration date, you must request a new code.  MyChart Access Code: [email or web address] Expires: 03/14/2017  2:08 PM  4. Enter your Social Security Number ([id number]) and Date of Birth (mm/dd/yyyy) as indicated and click Submit. You will be taken to the next sign-up page. 5. Create a MyChart ID. This will be your MyChart login ID and cannot be changed, so think of one that is secure and easy to remember. 6. Create a MyChart password. You can change your password at any time. 7. Enter your Password Reset Question and Answer. This can be used at a later time if you forget your password.  8. Enter your e-mail address. You will receive e-mail notification when new information is available in MyChart. 9. Click Sign Up. You can now view your medical record.   Additional Information Remember, MyChart is NOT to be used for urgent needs. For medical emergencies, dial 911.

## 2017-01-13 NOTE — Progress Notes (Signed)
    GYNECOLOGY CLINIC COLPOSCOPY PROCEDURE NOTE  41 y.o. [id number] here for colposcopy for ASC cannot exclude high grade lesion [facility] pap smear on 12/08/2016. Discussed need for close surveillance given concern about high grade lesion.  Patient given informed consent, signed copy in the chart, time out was performed.  Placed in lithotomy position. Cervix viewed with speculum and colposcope after application of acetic acid.   Colposcopy adequate? Yes Slightly acetowhite lesion noted at 3 o'clock; corresponding biopsy obtained.  ECC specimen obtained. All specimens were labeled and sent to pathology.   Patient was given post procedure instructions.  Will follow up pathology and manage accordingly; patient will be contacted with results and recommendations.  Routine preventative health maintenance measures emphasized.    Jaynie CollinsUGONNA  ANYANWU, MD, FACOG Attending Obstetrician & Gynecologist, [facility], [facility]

## 2017-10-13 ENCOUNTER — Encounter (HOSPITAL_COMMUNITY): Payer: Self-pay | Admitting: Emergency Medicine

## 2017-10-13 ENCOUNTER — Other Ambulatory Visit: Payer: Self-pay

## 2017-10-13 ENCOUNTER — Ambulatory Visit (HOSPITAL_COMMUNITY)
Admission: EM | Admit: 2017-10-13 | Discharge: 2017-10-13 | Disposition: A | Discharge status: Home / Self Care | Payer: BLUE CROSS/BLUE SHIELD | Attending: Family Medicine | Admitting: Family Medicine

## 2017-10-13 DIAGNOSIS — N760 Acute vaginitis: Secondary | ICD-10-CM | POA: Insufficient documentation

## 2017-10-13 DIAGNOSIS — Z79899 Other long term (current) drug therapy: Secondary | ICD-10-CM | POA: Insufficient documentation

## 2017-10-13 DIAGNOSIS — I10 Essential (primary) hypertension: Secondary | ICD-10-CM | POA: Diagnosis not present

## 2017-10-13 DIAGNOSIS — F17210 Nicotine dependence, cigarettes, uncomplicated: Secondary | ICD-10-CM | POA: Diagnosis not present

## 2017-10-13 DIAGNOSIS — Z9851 Tubal ligation status: Secondary | ICD-10-CM | POA: Diagnosis not present

## 2017-10-13 LAB — POCT URINALYSIS DIP (DEVICE)
BILIRUBIN URINE: NEGATIVE
Glucose, UA: NEGATIVE mg/dL
Hgb urine dipstick: NEGATIVE
Leukocytes, UA: NEGATIVE
Nitrite: NEGATIVE
PH: 5.5 (ref 5.0–8.0)
Protein, ur: NEGATIVE mg/dL
Urobilinogen, UA: 0.2 mg/dL (ref 0.0–1.0)

## 2017-10-13 MED ORDER — METRONIDAZOLE 500 MG PO TABS: 500.0000 mg | ORAL_TABLET | Freq: Two times a day (BID) | ORAL | 0 refills | Status: AC

## 2017-10-13 NOTE — ED Triage Notes (Signed)
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  She has been suffering from her recent episode for a few months.  She was trying home remedies with no relief.  Pt states she has seen a GYN about this in the past.

## 2017-10-13 NOTE — Discharge Instructions (Signed)
These take metronidazole twice daily for 7 days.  Please avoid alcohol while taking this.  Please follow-up with OB/GYN to discuss preventative regimens for BV.

## 2017-10-13 NOTE — ED Provider Notes (Signed)
[facility]    CSN: [id number] Arrival date & time: 10/13/17  1724     History   Chief Complaint Chief Complaint  Patient presents with  . bacterial vaginosis    HPI Robyn Ramirez is a 42 y.o. female history of tubal ligation presenting today with concern for BV.  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  States she gets them approximately every 3 months or so.  She has seen OB GYN for this and has made many modifications to her lifestyle.  She has changed underwear, does not use soap, only showers, because attempted probiotics.  Her main symptoms are discharge and odor.  Her symptoms this time is been going on for approximately 3 months, but they have worsened recently and odor has worsened.  She has requests oral treatment.  HPI  Past Medical History:  Diagnosis Date  . Hypertension    history of, no medication, resolved with weight loss    Patient Active Problem List   Diagnosis Date Noted  . 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 LAPAROSCOPIC TUBAL LIGATION Bilateral 09/27/2015   Procedure: LAPAROSCOPIC TUBAL LIGATION with Anna Genre CLIPS;  Surgeon: Tereso Newcomer, MD;  Location: [facility];  Service: Gynecology;  Laterality: Bilateral;  . MOUTH SURGERY      OB History    Gravida Para Term Preterm AB Living   8 3 3   5 3    SAB TAB Ectopic Multiple Live Births   1 4             Home Medications    Prior to Admission medications   Medication Sig Start Date End Date Taking? Authorizing Provider  docusate sodium (COLACE) 100 MG capsule Take 200 mg by mouth daily as needed for mild constipation. Reported on 11/12/2015    [provider]  docusate sodium (COLACE) 100 MG capsule Take 1 capsule (100 mg total) by mouth 2 (two) times daily as needed. Patient not taking: Reported on 01/13/2017 09/27/15    Tereso Newcomer, MD  metroNIDAZOLE (FLAGYL) 500 MG tablet Take 1 tablet (500 mg total) by mouth 2 (two) times daily for 7 days. 10/13/17 10/20/17  Dencil Cayson C, PA-C  Multiple Vitamin (MULTIVITAMIN WITH MINERALS) TABS tablet Take 1 tablet by mouth daily.    [provider]  nitrofurantoin, macrocrystal-monohydrate, (MACROBID) 100 MG capsule Take 100 mg by mouth 2 (two) times daily.    [provider]    Family History History reviewed. No pertinent family history.  Social History Social History   Tobacco Use  . Smoking status: Current Every Day Smoker    Packs/day: 0.10    Years: 20.00    Pack years: 2.00    Types: Cigarettes  . Smokeless tobacco: Never Used  Substance Use Topics  . Alcohol use: Yes    Comment: ocassional  . Drug use: No     Allergies   Patient has no known allergies.   Review of Systems Review of Systems  Constitutional: Negative for fever.  Respiratory: Negative for shortness of breath.   Cardiovascular: Negative for chest pain.  Gastrointestinal: Negative for abdominal pain, diarrhea, nausea and vomiting.  Genitourinary: Positive for vaginal discharge. Negative for dysuria, flank pain, frequency, genital sores, hematuria, menstrual problem, vaginal bleeding and vaginal pain.  Musculoskeletal: Negative for back pain.  Skin: Negative for rash.  Neurological: Negative for dizziness, light-headedness and headaches.     Physical Exam Triage Vital Signs ED Triage Vitals [10/13/17 1802]  Enc Vitals Group     BP 125/74     Pulse Rate 76     Resp      Temp 98.4 F (36.9 C)     Temp Source Oral     SpO2 100 %     Weight      Height      Head Circumference      Peak Flow      Pain Score      Pain Loc      Pain Edu?      Excl. in GC?    No data found.  Updated Vital Signs BP 125/74 (BP Location: Left Arm)   Pulse 76   Temp 98.4 F (36.9 C) (Oral)   LMP 10/04/2017 (Exact Date)   SpO2 100%   Visual Acuity Right  Eye Distance:   Left Eye Distance:   Bilateral Distance:    Right Eye Near:   Left Eye Near:    Bilateral Near:     Physical Exam  Constitutional: She appears well-developed and well-nourished. No distress.  HENT:  Head: Normocephalic and atraumatic.  Eyes: Conjunctivae are normal.  Neck: Neck supple.  Cardiovascular: Normal rate.  Pulmonary/Chest: Effort normal. No respiratory distress.  Genitourinary:  Genitourinary Comments: Deferred, denies pain and rashes.  Musculoskeletal: She exhibits no edema.  Neurological: She is alert.  Skin: Skin is warm and dry.  Psychiatric: She has a normal mood and affect.  Nursing note and vitals reviewed.    UC Treatments / Results  Labs (all labs ordered are listed, but only abnormal results are displayed) Labs Reviewed  URINE CYTOLOGY ANCILLARY ONLY    EKG  EKG Interpretation None       Radiology No results found.  Procedures Procedures (including critical care time)  Medications Ordered in UC Medications - No data to display   Initial Impression / Assessment and Plan / UC Course  I have reviewed the triage vital signs and the nursing notes.  Pertinent labs & imaging results that were available during my care of the patient were reviewed by me and considered in my medical decision making (see chart for details).     Go ahead and treat for BV.  Metronidazole oral twice daily times 7 days.  She is going to try probiotics more regularly.  She plans to follow-up with OB/GYN to discuss prophylactic regimens and if they are appropriate for her. Discussed strict return precautions. Patient verbalized understanding and is agreeable with plan.   Final Clinical Impressions(s) / UC Diagnoses   Final diagnoses:  Vaginitis and vulvovaginitis    ED Discharge Orders        Ordered    metroNIDAZOLE (FLAGYL) 500 MG tablet  2 times daily     10/13/17 1822       Controlled Substance Prescriptions [place] Controlled Substance  Registry consulted? Not Applicable   Lew DawesWieters, Annetta Deiss C, [place]PA-C 10/13/17 1828

## 2017-10-14 LAB — POCT PREGNANCY, URINE: Preg Test, Ur: NEGATIVE

## 2017-10-14 LAB — URINE CYTOLOGY ANCILLARY ONLY
Chlamydia: NEGATIVE
Neisseria Gonorrhea: NEGATIVE
Trichomonas: POSITIVE — AB

## 2017-10-16 LAB — URINE CYTOLOGY ANCILLARY ONLY: Candida vaginitis: NEGATIVE

## 2017-10-21 ENCOUNTER — Ambulatory Visit: Payer: BLUE CROSS/BLUE SHIELD | Admitting: Physician Assistant

## 2017-10-21 ENCOUNTER — Encounter: Payer: Self-pay | Admitting: Physician Assistant

## 2017-10-21 ENCOUNTER — Other Ambulatory Visit: Payer: Self-pay

## 2017-10-21 VITALS — BP 124/90 | HR 79 | Temp 98.6°F | Resp 16 | Ht 66.5 in | Wt 206.6 lb

## 2017-10-21 DIAGNOSIS — Z1329 Encounter for screening for other suspected endocrine disorder: Secondary | ICD-10-CM

## 2017-10-21 DIAGNOSIS — Z0000 Encounter for general adult medical examination without abnormal findings: Secondary | ICD-10-CM

## 2017-10-21 DIAGNOSIS — Z13228 Encounter for screening for other metabolic disorders: Secondary | ICD-10-CM

## 2017-10-21 DIAGNOSIS — F3289 Other specified depressive episodes: Secondary | ICD-10-CM

## 2017-10-21 DIAGNOSIS — Z13220 Encounter for screening for lipoid disorders: Secondary | ICD-10-CM | POA: Diagnosis not present

## 2017-10-21 DIAGNOSIS — Z130 Encounter for screening for diseases of the blood and blood-forming organs and certain disorders involving the immune mechanism: Secondary | ICD-10-CM | POA: Diagnosis not present

## 2017-10-21 DIAGNOSIS — Z113 Encounter for screening for infections with a predominantly sexual mode of transmission: Secondary | ICD-10-CM | POA: Diagnosis not present

## 2017-10-21 DIAGNOSIS — A599 Trichomoniasis, unspecified: Secondary | ICD-10-CM

## 2017-10-21 MED ORDER — BUPROPION HCL ER (SR) 150 MG PO TB12: 150.0000 mg | ORAL_TABLET | Freq: Two times a day (BID) | ORAL | 1 refills | Status: DC

## 2017-10-21 MED ORDER — METRONIDAZOLE 500 MG PO TABS: 500.0000 mg | ORAL_TABLET | Freq: Two times a day (BID) | ORAL | 0 refills | Status: DC

## 2017-10-21 NOTE — Patient Instructions (Addendum)
I would like you to restart the Wellbutrin at this time.   Keeping You Healthy  Get These Tests 1. Blood Pressure- Have your blood pressure checked once a year by your health care provider.  Normal blood pressure is 120/80. 2. Weight- Have your body mass index (BMI) calculated to screen for obesity.  BMI is measure of body fat based on height and weight.  You can also calculate your own BMI at https://www.west-esparza.com/. 3. Cholesterol- Have your cholesterol checked every 5 years starting at age 13 then yearly starting at age 37. 4. Chlamydia, HIV, and other sexually transmitted diseases- Get screened every year until age 57, then within three months of each new sexual provider. 5. Pap Test - Every 1-5 years; discuss with your health care provider. 6. Mammogram- Every 1-2 years starting at age 86--50  Take these medicines  Calcium with Vitamin D-Your body needs 1200 mg of Calcium each day and [phone] IU of Vitamin D daily.  Your body can only absorb 500 mg of Calcium at a time so Calcium must be taken in 2 or 3 divided doses throughout the day.  Multivitamin with folic acid- Once daily if it is possible for you to become pregnant.  Get these Immunizations  Gardasil-Series of three doses; prevents HPV related illness such as genital warts and cervical cancer.  Menactra-Single dose; prevents meningitis.  Tetanus shot- Every 10 years.  Flu shot-Every year.  Take these steps 1. Do not smoke-Your healthcare provider can help you quit.  For tips on how to quit go to www.smokefree.gov or call 1-800 QUITNOW. 2. Be physically active- Exercise 5 days a week for at least 30 minutes.  If you are not already physically active, start slow and gradually work up to 30 minutes of moderate physical activity.  Examples of moderate activity include walking briskly, dancing, swimming, bicycling, etc. 3. Breast Cancer- A self breast exam every month is important for early detection of breast cancer.  For  more information and instruction on self breast exams, ask your healthcare provider or [email or web address]. 4. Eat a healthy diet- Eat a variety of healthy foods such as fruits, vegetables, whole grains, low fat milk, low fat cheeses, yogurt, lean meats, poultry and fish, beans, nuts, tofu, etc.  For more information go to www. Thenutritionsource.org 5. Drink alcohol in moderation- Limit alcohol intake to one drink or less per day. Never drink and drive. 6. Depression- Your emotional health is as important as your physical health.  If you're feeling down or losing interest in things you normally enjoy please talk to your healthcare provider about being screened for depression. 7. Dental visit- Brush and floss your teeth twice daily; visit your dentist twice a year. 8. Eye doctor- Get an eye exam at least every 2 years. 9. Helmet use- Always wear a helmet when riding a bicycle, motorcycle, rollerblading or skateboarding. 10. Safe sex- If you may be exposed to sexually transmitted infections, use a condom. 11. Seat belts- Seat belts can save your live; always wear one. 12. Smoke/Carbon Monoxide detectors- These detectors need to be installed on the appropriate level of your home. Replace batteries at least once a year. 13. Skin cancer- When out in the sun please cover up and use sunscreen 15 SPF or higher. 14. Violence- If anyone is threatening or hurting you, please tell your healthcare provider.         Bupropion tablets (Depression/Mood Disorders) What is this medicine? BUPROPION (byoo PROE pee on) is used to treat  depression. This medicine may be used for other purposes; ask your health care provider or pharmacist if you have questions. COMMON BRAND NAME(S): Wellbutrin What should I tell my health care provider before I take this medicine? They need to know if you have any of these conditions: -an eating disorder, such as anorexia or bulimia -bipolar disorder  or psychosis -diabetes or high blood sugar, treated with medication -glaucoma -heart disease, previous heart attack, or irregular heart beat -head injury or brain tumor -high blood pressure -kidney or liver disease -seizures -suicidal thoughts or a previous suicide attempt -Tourette's syndrome -weight loss -an unusual or allergic reaction to bupropion, other medicines, foods, dyes, or preservatives -breast-feeding -pregnant or trying to become pregnant How should I use this medicine? Take this medicine by mouth with a glass of water. Follow the directions on the prescription label. You can take it with or without food. If it upsets your stomach, take it with food. Take your medicine at regular intervals. Do not take your medicine more often than directed. Do not stop taking this medicine suddenly except upon the advice of your doctor. Stopping this medicine too quickly may cause serious side effects or your condition may worsen. A special MedGuide will be given to you by the pharmacist with each prescription and refill. Be sure to read this information carefully each time. Talk to your pediatrician regarding the use of this medicine in children. Special care may be needed. Overdosage: If you think you have taken too much of this medicine contact a poison control center or emergency room at once. NOTE: This medicine is only for you. Do not share this medicine with others. What if I miss a dose? If you miss a dose, take it as soon as you can. If it is less than four hours to your next dose, take only that dose and skip the missed dose. Do not take double or extra doses. What may interact with this medicine? Do not take this medicine with any of the following medications: -linezolid -MAOIs like Azilect, Carbex, Eldepryl, Marplan, Nardil, and Parnate -methylene blue (injected into a vein) -other medicines that contain bupropion like Zyban This medicine may also interact with the following  medications: -alcohol -certain medicines for anxiety or sleep -certain medicines for blood pressure like metoprolol, propranolol -certain medicines for depression or psychotic disturbances -certain medicines for HIV or AIDS like efavirenz, lopinavir, nelfinavir, ritonavir -certain medicines for irregular heart beat like propafenone, flecainide -certain medicines for Parkinson's disease like amantadine, levodopa -certain medicines for seizures like carbamazepine, phenytoin, phenobarbital -cimetidine -clopidogrel -cyclophosphamide -digoxin -furazolidone -isoniazid -nicotine -orphenadrine -procarbazine -steroid medicines like prednisone or cortisone -stimulant medicines for attention disorders, weight loss, or to stay awake -tamoxifen -theophylline -thiotepa -ticlopidine -tramadol -warfarin This list may not describe all possible interactions. Give your health care provider a list of all the medicines, herbs, non-prescription drugs, or dietary supplements you use. Also tell them if you smoke, drink alcohol, or use illegal drugs. Some items may interact with your medicine. What should I watch for while using this medicine? Tell your doctor if your symptoms do not get better or if they get worse. Visit your doctor or health care professional for regular checks on your progress. Because it may take several weeks to see the full effects of this medicine, it is important to continue your treatment as prescribed by your doctor. Patients and their families should watch out for new or worsening thoughts of suicide or depression. Also watch out for sudden  changes in feelings such as feeling anxious, agitated, panicky, irritable, hostile, aggressive, impulsive, severely restless, overly excited and hyperactive, or not being able to sleep. If this happens, especially at the beginning of treatment or after a change in dose, call your health care professional. Avoid alcoholic drinks while taking this  medicine. Drinking excessive alcoholic beverages, using sleeping or anxiety medicines, or quickly stopping the use of these agents while taking this medicine may increase your risk for a seizure. Do not drive or use heavy machinery until you know how this medicine affects you. This medicine can impair your ability to perform these tasks. Do not take this medicine close to bedtime. It may prevent you from sleeping. Your mouth may get dry. Chewing sugarless gum or sucking hard candy, and drinking plenty of water may help. Contact your doctor if the problem does not go away or is severe. What side effects may I notice from receiving this medicine? Side effects that you should report to your doctor or health care professional as soon as possible: -allergic reactions like skin rash, itching or hives, swelling of the face, lips, or tongue -breathing problems -changes in vision -confusion -elevated mood, decreased need for sleep, racing thoughts, impulsive behavior -fast or irregular heartbeat -hallucinations, loss of contact with reality -increased blood pressure -redness, blistering, peeling or loosening of the skin, including inside the mouth -seizures -suicidal thoughts or other mood changes -unusually weak or tired -vomiting Side effects that usually do not require medical attention (report to your doctor or health care professional if they continue or are bothersome): -constipation -headache -loss of appetite -nausea -tremors -weight loss This list may not describe all possible side effects. Call your doctor for medical advice about side effects. You may report side effects to FDA at 1-800-FDA-1088. Where should I keep my medicine? Keep out of the reach of children. Store at room temperature between 20 and 25 degrees C (68 and 77 degrees F), away from direct sunlight and moisture. Keep tightly closed. Throw away any unused medicine after the expiration date. NOTE: This sheet is a  summary. It may not cover all possible information. If you have questions about this medicine, talk to your doctor, pharmacist, or health care provider.  2018 Elsevier/Gold Standard (2016-02-08 13:44:21)   IF you received an x-ray today, you will receive an invoice from [facility]. Please contact [facility] at [phone] with questions or concerns regarding your invoice.   IF you received labwork today, you will receive an invoice from [facility]. Please contact [facility] at [phone] with questions or concerns regarding your invoice.   Our billing staff will not be able to assist you with questions regarding bills from these companies.  You will be contacted with the lab results as soon as they are available. The fastest way to get your results is to activate your My Chart account. Instructions are located on the last page of this paperwork. If you have not heard from us regarding the results in 2 weeks, please contact this office.

## 2017-10-21 NOTE — Progress Notes (Signed)
PRIMARY CARE AT [facility] [address], [place] [address] [phone]  Date:  10/21/2017   Name:  Robyn Ramirez   DOB:  14-Feb-1976   MRN:  [id number]  PCP:  Patient, No Pcp Per    History of Present Illness:  Robyn Ramirez is a 42 y.o. female patient who presents to PCP with  Chief Complaint  Patient presents with  . new pt    per pt treated for BV by [place] on 10/13/17, "meds were in pocketbook and water spilled, got medication unable to complete full dosage.   . Medication Refill    Metronidazole 500 mg 1 tablet by mo9uth BID for 7 days, per pt "only had 2 days worth that she had taken."  . depression scale during triage, score 20   Diet: she is eating oatmeal or eggs and [place] sausage.  She attempts to stay away from carbs.  Water intake 84 oz. 4oz of coffee.    BM: constipation.    URINATION: Dysuria, hematuria, frequency.    Appetite change. She is not eating much, and generally snacking Exercise: use to a lot, but she has loss of interest.   She is sleeping more.  She can get up and feel like she could go back to sleep. 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  Anhedonia.  Feels like she does not want to be with her kids much to hang.   She is concerned of loosing her libido.   She notes that she does not have much anxiety/  25, 14, 9--lives with the younger children.  They used to play sports, but  3 cigarettes per day.   EtOH: drink socially but not often.   No illicit drug use.   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  She has not told anyone about this.  She has had several outbreaks.  This started, in her teens?  No abnormal vaginal discharge, dysuria, or hematuria.   Smoking and wishes to quit mood is slightly sad.  She has no interest in doing the things she normally does.  She also endorses appetite change.  No change in sleep, feeling both detached and sometimes irritated, and loss of energy.  It concerns her  because her family has noticed.  She denies suicidal homicidal thoughts.   She also is here for refill of metronidazole.  She was diagnosed with trichomonas 8 days ago.  She had the medicine in her purse but the pills got wet and dissolved. Patient Active Problem List   Diagnosis Date Noted  . 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 Depression   . Hypertension    history of, no medication, resolved with weight loss    Past Surgical History:  Procedure Laterality Date  . LAPAROSCOPIC TUBAL LIGATION Bilateral 09/27/2015   Procedure: LAPAROSCOPIC TUBAL LIGATION with Sharmon Leyden CLIPS;  Surgeon: Osborne Oman, MD;  Location: [facility];  Service: Gynecology;  Laterality: Bilateral;  . MOUTH SURGERY    . TUBAL LIGATION      Social History   Tobacco Use  . Smoking status: Current Every Day Smoker    Packs/day: 0.10    Years: 20.00    Pack years: 2.00    Types: Cigarettes  . Smokeless tobacco: Never Used  Substance Use Topics  . Alcohol use: Yes  Alcohol/week: 0.6 oz    Types: 1 Standard drinks or equivalent per week    Comment: ocassional  . Drug use: No    Family History  Problem Relation Age of Onset  . Hypertension Mother   . Hyperlipidemia Maternal Grandmother   . Cancer Paternal Grandmother     No Known Allergies  Medication list has been reviewed and updated.  Current Outpatient Medications on File Prior to Visit  Medication Sig Dispense Refill  . docusate sodium (COLACE) 100 MG capsule Take 200 mg by mouth daily as needed for mild constipation. Reported on 11/12/2015    . docusate sodium (COLACE) 100 MG capsule Take 1 capsule (100 mg total) by mouth 2 (two) times daily as needed. 30 capsule 2  . Multiple Vitamin (MULTIVITAMIN WITH MINERALS) TABS tablet Take 1 tablet by mouth daily.     No current facility-administered medications on  file prior to visit.     Review of Systems  Constitutional: Negative for chills and fever.  HENT: Negative for ear discharge, ear pain and sore throat.   Eyes: Negative for blurred vision and double vision.  Respiratory: Negative for cough, shortness of breath and wheezing.   Cardiovascular: Negative for chest pain, palpitations and leg swelling.  Gastrointestinal: Negative for diarrhea, nausea and vomiting.  Genitourinary: Negative for dysuria, frequency and hematuria.  Skin: Positive for rash (intermittent clustered outbreaks.). Negative for itching.  Neurological: Negative for dizziness and headaches.   ROS otherwise unremarkable unless listed above.  Physical Examination: BP 124/90   Pulse 79   Temp 98.6 F (37 C) (Oral)   Resp 16   Ht 5' 6.5" (1.689 m)   Wt 206 lb 9.6 oz (93.7 kg)   LMP 10/04/2017 (Exact Date)   SpO2 99%   BMI 32.85 kg/m  Ideal Body Weight: Weight in (lb) to have BMI = 25: 156.9  Physical Exam  Constitutional: She is oriented to person, place, and time. She appears well-developed and well-nourished. No distress.  HENT:  Head: Normocephalic and atraumatic.  Right Ear: Tympanic membrane, external ear and ear canal normal.  Left Ear: Tympanic membrane, external ear and ear canal normal.  Nose: Right sinus exhibits no maxillary sinus tenderness and no frontal sinus tenderness. Left sinus exhibits no maxillary sinus tenderness and no frontal sinus tenderness.  Mouth/Throat: Oropharynx is clear and moist. No uvula swelling. No oropharyngeal exudate, posterior oropharyngeal edema or posterior oropharyngeal erythema.  Eyes: Pupils are equal, round, and reactive to light. Conjunctivae and EOM are normal.  Neck: Normal range of motion. Neck supple. No thyromegaly present.  Cardiovascular: Normal rate, regular rhythm, normal heart sounds and intact distal pulses. Exam reveals no gallop, no distant heart sounds and no friction rub.  No murmur  heard. Pulmonary/Chest: Effort normal and breath sounds normal. No respiratory distress. She has no decreased breath sounds. She has no wheezes. She has no rhonchi.  Abdominal: Soft. Bowel sounds are normal. She exhibits no distension and no mass. There is no tenderness.  Musculoskeletal: Normal range of motion. She exhibits no edema or tenderness.  Lymphadenopathy:       Head (right side): No submandibular, no tonsillar, no preauricular and no posterior auricular adenopathy present.       Head (left side): No submandibular, no tonsillar, no preauricular and no posterior auricular adenopathy present.    She has no cervical adenopathy.  Neurological: She is alert and oriented to person, place, and time. No cranial nerve deficit. She exhibits normal muscle tone. Coordination  normal.  Skin: Skin is warm and dry. She is not diaphoretic.  Psychiatric: She has a normal mood and affect. Her behavior is normal.    Wt Readings from Last 3 Encounters:  10/21/17 206 lb 9.6 oz (93.7 kg)  01/13/17 198 lb (89.8 kg)  12/08/16 198 lb 12.8 oz (90.2 kg)   Results for orders placed or performed in visit on 10/21/17  CBC  Result Value Ref Range   WBC 10.2 3.4 - 10.8 x10E3/uL   RBC 4.32 3.77 - 5.28 x10E6/uL   Hemoglobin 13.3 11.1 - 15.9 g/dL   Hematocrit 40.3 34.0 - 46.6 %   MCV 93 79 - 97 fL   MCH 30.8 26.6 - 33.0 pg   MCHC 33.0 31.5 - 35.7 g/dL   RDW 14.7 12.3 - 15.4 %   Platelets 314 150 - 379 x10E3/uL  CMP14+EGFR  Result Value Ref Range   Glucose 82 65 - 99 mg/dL   BUN 14 6 - 20 mg/dL   Creatinine, Ser 0.84 0.57 - 1.00 mg/dL   GFR calc non Af Amer 88 >59 mL/min/1.73   GFR calc Af Amer 101 >59 mL/min/1.73   BUN/Creatinine Ratio 17 9 - 23   Sodium 139 134 - 144 mmol/L   Potassium 3.9 3.5 - 5.2 mmol/L   Chloride 103 96 - 106 mmol/L   CO2 22 20 - 29 mmol/L   Calcium 9.6 8.7 - 10.2 mg/dL   Total Protein 7.7 6.0 - 8.5 g/dL   Albumin 4.5 3.5 - 5.5 g/dL   Globulin, Total 3.2 1.5 - 4.5 g/dL    Albumin/Globulin Ratio 1.4 1.2 - 2.2   Bilirubin Total 0.3 0.0 - 1.2 mg/dL   Alkaline Phosphatase 69 39 - 117 IU/L   AST 19 0 - 40 IU/L   ALT 16 0 - 32 IU/L  Lipid panel  Result Value Ref Range   Cholesterol, Total 147 100 - 199 mg/dL   Triglycerides 81 0 - 149 mg/dL   HDL 54 >39 mg/dL   VLDL Cholesterol Cal 16 5 - 40 mg/dL   LDL Calculated 77 0 - 99 mg/dL   Chol/HDL Ratio 2.7 0.0 - 4.4 ratio  TSH  Result Value Ref Range   TSH 1.140 0.450 - 4.500 uIU/mL  HSV(herpes simplex vrs) 1+2 ab-IgG  Result Value Ref Range   HSV 1 Glycoprotein G Ab, IgG <0.91 0.00 - 0.90 index   HSV 2 IgG, Type Spec >23.60 (H) 0.00 - 0.90 index      Assessment and Plan: Robyn Ramirez is a 42 y.o. female who is here today for cc of  Chief Complaint  Patient presents with  . new pt    per pt treated for BV by [place] on 10/13/17, "meds were in pocketbook and water spilled, got medication unable to complete full dosage.   . Medication Refill    Metronidazole 500 mg 1 tablet by mo9uth BID for 7 days, per pt "only had 2 days worth that she had taken."  . depression scale during triage, score 20   --Wellbutrin for both depression and smoke fixation will be restarted.  she will follow-up in 5 weeks for recheck.   --I am refilling the metronidazole  Annual physical exam - Plan: CBC, CMP14+EGFR, Lipid panel, TSH, buPROPion (WELLBUTRIN SR) 150 MG 12 hr tablet, HSV(herpes simplex vrs) 1+2 ab-IgG, DISCONTINUED: buPROPion (WELLBUTRIN SR) 150 MG 12 hr tablet, DISCONTINUED: metroNIDAZOLE (FLAGYL) 500 MG tablet, DISCONTINUED: buPROPion (WELLBUTRIN SR) 150 MG 12 hr tablet  Screening for deficiency anemia - Plan: CBC  Screening for lipid disorders - Plan: Lipid panel  Screening for thyroid disorder - Plan: TSH  Screening for metabolic disorder - Plan: CMP14+EGFR  Trichomoniasis - Plan: DISCONTINUED: metroNIDAZOLE (FLAGYL) 500 MG tablet  Screening for STD (sexually transmitted disease) - Plan: HSV(herpes  simplex vrs) 1+2 ab-IgG  Ivar Drape, PA-C [facility] 3/27/20191:00 PM

## 2017-10-22 ENCOUNTER — Ambulatory Visit: Payer: Self-pay | Admitting: Family Medicine

## 2017-10-22 DIAGNOSIS — Z113 Encounter for screening for infections with a predominantly sexual mode of transmission: Secondary | ICD-10-CM | POA: Diagnosis not present

## 2017-10-22 DIAGNOSIS — Z0000 Encounter for general adult medical examination without abnormal findings: Secondary | ICD-10-CM | POA: Diagnosis not present

## 2017-10-22 LAB — CMP14+EGFR
A/G RATIO: 1.4 (ref 1.2–2.2)
ALT: 16 IU/L (ref 0–32)
AST: 19 IU/L (ref 0–40)
Albumin: 4.5 g/dL (ref 3.5–5.5)
Alkaline Phosphatase: 69 IU/L (ref 39–117)
BUN/Creatinine Ratio: 17 (ref 9–23)
BUN: 14 mg/dL (ref 6–20)
Bilirubin Total: 0.3 mg/dL (ref 0.0–1.2)
CALCIUM: 9.6 mg/dL (ref 8.7–10.2)
CO2: 22 mmol/L (ref 20–29)
Chloride: 103 mmol/L (ref 96–106)
Creatinine, Ser: 0.84 mg/dL (ref 0.57–1.00)
GFR calc Af Amer: 101 mL/min/{1.73_m2} (ref >59)
GFR calc non Af Amer: 88 mL/min/{1.73_m2} (ref >59)
GLUCOSE: 82 mg/dL (ref 65–99)
Globulin, Total: 3.2 g/dL (ref 1.5–4.5)
POTASSIUM: 3.9 mmol/L (ref 3.5–5.2)
Sodium: 139 mmol/L (ref 134–144)
Total Protein: 7.7 g/dL (ref 6.0–8.5)

## 2017-10-22 LAB — CBC
Hematocrit: 40.3 % (ref 34.0–46.6)
Hemoglobin: 13.3 g/dL (ref 11.1–15.9)
MCH: 30.8 pg (ref 26.6–33.0)
MCHC: 33 g/dL (ref 31.5–35.7)
MCV: 93 fL (ref 79–97)
PLATELETS: 314 10*3/uL (ref 150–379)
RBC: 4.32 x10E6/uL (ref 3.77–5.28)
RDW: 14.7 % (ref 12.3–15.4)
WBC: 10.2 10*3/uL (ref 3.4–10.8)

## 2017-10-22 LAB — TSH: TSH: 1.14 u[IU]/mL (ref 0.450–4.500)

## 2017-10-22 LAB — LIPID PANEL
Chol/HDL Ratio: 2.7 ratio (ref 0.0–4.4)
Cholesterol, Total: 147 mg/dL (ref 100–199)
HDL: 54 mg/dL (ref >39)
LDL Calculated: 77 mg/dL (ref 0–99)
Triglycerides: 81 mg/dL (ref 0–149)
VLDL Cholesterol Cal: 16 mg/dL (ref 5–40)

## 2017-10-23 LAB — HSV(HERPES SIMPLEX VRS) I + II AB-IGG
HSV 1 Glycoprotein G Ab, IgG: <0.91 index (ref 0.00–0.90)
HSV 2 IgG, Type Spec: >23.6 index — ABNORMAL HIGH (ref 0.00–0.90)

## 2017-10-28 ENCOUNTER — Encounter (HOSPITAL_COMMUNITY): Payer: Self-pay | Admitting: Emergency Medicine

## 2017-10-28 ENCOUNTER — Ambulatory Visit (HOSPITAL_COMMUNITY)
Admission: EM | Admit: 2017-10-28 | Discharge: 2017-10-28 | Disposition: A | Discharge status: Home / Self Care | Payer: BLUE CROSS/BLUE SHIELD | Attending: Urgent Care | Admitting: Urgent Care

## 2017-10-28 ENCOUNTER — Other Ambulatory Visit: Payer: Self-pay

## 2017-10-28 DIAGNOSIS — Z20828 Contact with and (suspected) exposure to other viral communicable diseases: Secondary | ICD-10-CM

## 2017-10-28 DIAGNOSIS — B9789 Other viral agents as the cause of diseases classified elsewhere: Secondary | ICD-10-CM

## 2017-10-28 DIAGNOSIS — J069 Acute upper respiratory infection, unspecified: Secondary | ICD-10-CM | POA: Diagnosis not present

## 2017-10-28 DIAGNOSIS — J029 Acute pharyngitis, unspecified: Secondary | ICD-10-CM

## 2017-10-28 MED ORDER — OSELTAMIVIR PHOSPHATE 75 MG PO CAPS: 75.0000 mg | ORAL_CAPSULE | Freq: Two times a day (BID) | ORAL | 0 refills | Status: DC

## 2017-10-28 MED ORDER — PSEUDOEPHEDRINE HCL ER 120 MG PO TB12: 120.0000 mg | ORAL_TABLET | Freq: Two times a day (BID) | ORAL | 3 refills | Status: DC

## 2017-10-28 MED ORDER — BENZONATATE 100 MG PO CAPS: 100.0000 mg | ORAL_CAPSULE | Freq: Three times a day (TID) | ORAL | 0 refills | Status: DC | PRN

## 2017-10-28 NOTE — ED Triage Notes (Signed)
Pt states "I feel like im coming down with something, I have an itchy throat, chills".

## 2017-10-28 NOTE — Discharge Instructions (Signed)
You may take 500mg Tylenol with ibuprofen 400-600mg every 6 hours for pain and inflammation. For sore throat try using a honey-based tea. Use 3 teaspoons of honey with juice squeezed from half lemon. Place shaved pieces of ginger into 1/2-1 cup of water and warm over stove top. Then mix the ingredients and repeat every 4 hours as needed. Hydrate well with at least 2 liters (1 gallon) of water daily.  °

## 2017-10-28 NOTE — ED Provider Notes (Signed)
  MRN: [id number] DOB: Nov 28, 1975  Subjective:   Robyn Ramirez is a 42 y.o. female presenting for 1 day history of malaise, fatigue, stuffy nose, scratchy throat, mild productive cough. Her daughter tested positive for the flu. Denies fever, body aches, headaches, ear pain, chest pain, shob, wheezing, n/v, abdominal pain. Has tried an otc decongestant with minimal relief. Smokes ~2 cigarettes per day. Denies history of asthma, allergies. Takes Wellbutrin daily.  Robyn Ramirez has No Known Allergies.  Robyn Ramirez  has a past medical history of Depression and Hypertension. Also  has a past surgical history that includes Mouth surgery; Laparoscopic tubal ligation (Bilateral, 09/27/2015); and Tubal ligation.  Objective:   Vitals: BP 122/65   Pulse 95   Temp 98.4 F (36.9 C)   Resp 18   LMP 10/04/2017 (Exact Date)   SpO2 100%   Physical Exam  Constitutional: She is oriented to person, place, and time. She appears well-developed and well-nourished.  HENT:  TM's intact bilaterally, no effusions or erythema. Nasal turbinates pink, moist, nasal passages patent. No sinus tenderness. Oropharynx thick streaks of post-nasal drainage, mucous membranes moist.    Eyes: Right eye exhibits no discharge. Left eye exhibits no discharge.  Neck: Normal range of motion. Neck supple.  Cardiovascular: Normal rate, regular rhythm and intact distal pulses. Exam reveals no gallop and no friction rub.  No murmur heard. Pulmonary/Chest: No respiratory distress. She has no wheezes. She has no rales.  Lymphadenopathy:    She has no cervical adenopathy.  Neurological: She is alert and oriented to person, place, and time.  Skin: Skin is warm and dry.  Psychiatric: She has a normal mood and affect.   Assessment and Plan :   Viral URI with cough  Sore throat  Exposure to the flu  Start supportive care for URI. Offered Tamiflu, Tessalon. Return-to-clinic precautions discussed, patient verbalized understanding.     Wallis BambergMani, Selby Foisy, [place] JerseyPA-C 10/28/17 Rickey [id number]

## 2017-11-09 ENCOUNTER — Encounter: Payer: Self-pay | Admitting: Physician Assistant

## 2017-11-10 ENCOUNTER — Other Ambulatory Visit: Payer: Self-pay | Admitting: Physician Assistant

## 2017-11-10 DIAGNOSIS — B009 Herpesviral infection, unspecified: Secondary | ICD-10-CM

## 2017-11-10 MED ORDER — VALACYCLOVIR HCL 1 G PO TABS: 1000.0000 mg | ORAL_TABLET | Freq: Every day | ORAL | 3 refills | Status: DC

## 2017-11-30 ENCOUNTER — Ambulatory Visit: Payer: BLUE CROSS/BLUE SHIELD | Admitting: Physician Assistant

## 2017-12-09 ENCOUNTER — Encounter: Payer: Self-pay | Admitting: Physician Assistant

## 2017-12-19 ENCOUNTER — Other Ambulatory Visit: Payer: Self-pay | Admitting: Physician Assistant

## 2017-12-19 DIAGNOSIS — F3289 Other specified depressive episodes: Secondary | ICD-10-CM

## 2017-12-19 DIAGNOSIS — Z0000 Encounter for general adult medical examination without abnormal findings: Secondary | ICD-10-CM

## 2017-12-19 NOTE — Telephone Encounter (Signed)
Please advise,refill of wellbutrin

## 2017-12-30 ENCOUNTER — Other Ambulatory Visit: Payer: Self-pay | Admitting: Physician Assistant

## 2017-12-30 DIAGNOSIS — F3289 Other specified depressive episodes: Secondary | ICD-10-CM

## 2017-12-30 DIAGNOSIS — Z0000 Encounter for general adult medical examination without abnormal findings: Secondary | ICD-10-CM

## 2017-12-31 ENCOUNTER — Other Ambulatory Visit: Payer: Self-pay

## 2017-12-31 ENCOUNTER — Ambulatory Visit: Payer: BLUE CROSS/BLUE SHIELD | Admitting: Family Medicine

## 2017-12-31 ENCOUNTER — Encounter: Payer: Self-pay | Admitting: Family Medicine

## 2017-12-31 VITALS — BP 119/82 | HR 106 | Temp 98.3°F | Resp 16 | Ht 66.5 in | Wt 208.2 lb

## 2017-12-31 DIAGNOSIS — R454 Irritability and anger: Secondary | ICD-10-CM

## 2017-12-31 DIAGNOSIS — F3289 Other specified depressive episodes: Secondary | ICD-10-CM

## 2017-12-31 DIAGNOSIS — Z716 Tobacco abuse counseling: Secondary | ICD-10-CM | POA: Diagnosis not present

## 2017-12-31 MED ORDER — BUSPIRONE HCL 5 MG PO TABS: 5.0000 mg | ORAL_TABLET | Freq: Three times a day (TID) | ORAL | 0 refills | Status: DC | PRN

## 2017-12-31 MED ORDER — BUPROPION HCL ER (SR) 150 MG PO TB12: 150.0000 mg | ORAL_TABLET | Freq: Two times a day (BID) | ORAL | 1 refills | Status: DC

## 2017-12-31 NOTE — Patient Instructions (Addendum)
Take buspirone at the end of the day before going home to start and see how you respond Can increase up to 3 times a day  Coping with Quitting Smoking Quitting smoking is a physical and mental challenge. You will face cravings, withdrawal symptoms, and temptation. Before quitting, work with your health care provider to make a plan that can help you cope. Preparation can help you quit and keep you from giving in. How can I cope with cravings? Cravings usually last for 5-10 minutes. If you get through it, the craving will pass. Consider taking the following actions to help you cope with cravings:  Keep your mouth busy: ? Chew sugar-free gum. ? Suck on hard candies or a straw. ? Brush your teeth.  Keep your hands and body busy: ? Immediately change to a different activity when you feel a craving. ? Squeeze or play with a ball. ? Do an activity or a hobby, like making bead jewelry, practicing needlepoint, or working with wood. ? Mix up your normal routine. ? Take a short exercise break. Go for a quick walk or run up and down stairs. ? Spend time in public places where smoking is not allowed.  Focus on doing something kind or helpful for someone else.  Call a friend or family member to talk during a craving.  Join a support group.  Call a quit line, such as 1-800-QUIT-NOW.  Talk with your health care provider about medicines that might help you cope with cravings and make quitting easier for you.  How can I deal with withdrawal symptoms? Your body may experience negative effects as it tries to get used to not having nicotine in the system. These effects are called withdrawal symptoms. They may include:  Feeling hungrier than normal.  Trouble concentrating.  Irritability.  Trouble sleeping.  Feeling depressed.  Restlessness and agitation.  Craving a cigarette.  To manage withdrawal symptoms:  Avoid places, people, and activities that trigger your cravings.  Remember why  you want to quit.  Get plenty of sleep.  Avoid coffee and other caffeinated drinks. These may worsen some of your symptoms.  How can I handle social situations? Social situations can be difficult when you are quitting smoking, especially in the first few weeks. To manage this, you can:  Avoid parties, bars, and other social situations where people might be smoking.  Avoid alcohol.  Leave right away if you have the urge to smoke.  Explain to your family and friends that you are quitting smoking. Ask for understanding and support.  Plan activities with friends or family where smoking is not an option.  What are some ways I can cope with stress? Wanting to smoke may cause stress, and stress can make you want to smoke. Find ways to manage your stress. Relaxation techniques can help. For example:  Breathe slowly and deeply, in through your nose and out through your mouth.  Listen to soothing, relaxing music.  Talk with a family member or friend about your stress.  Light a candle.  Soak in a bath or take a shower.  Think about a peaceful place.  What are some ways I can prevent weight gain? Be aware that many people gain weight after they quit smoking. However, not everyone does. To keep from gaining weight, have a plan in place before you quit and stick to the plan after you quit. Your plan should include:  Having healthy snacks. When you have a craving, it may help to: ?  Eat plain popcorn, crunchy carrots, celery, or other cut vegetables. ? Chew sugar-free gum.  Changing how you eat: ? Eat small portion sizes at meals. ? Eat 4-6 small meals throughout the day instead of 1-2 large meals a day. ? Be mindful when you eat. Do not watch television or do other things that might distract you as you eat.  Exercising regularly: ? Make time to exercise each day. If you do not have time for a long workout, do short bouts of exercise for 5-10 minutes several times a day. ? Do some  form of strengthening exercise, like weight lifting, and some form of aerobic exercise, like running or swimming.  Drinking plenty of water or other low-calorie or no-calorie drinks. Drink 6-8 glasses of water daily, or as much as instructed by your health care provider.  Summary  Quitting smoking is a physical and mental challenge. You will face cravings, withdrawal symptoms, and temptation to smoke again. Preparation can help you as you go through these challenges.  You can cope with cravings by keeping your mouth busy (such as by chewing gum), keeping your body and hands busy, and making calls to family, friends, or a helpline for people who want to quit smoking.  You can cope with withdrawal symptoms by avoiding places where people smoke, avoiding drinks with caffeine, and getting plenty of rest.  Ask your health care provider about the different ways to prevent weight gain, avoid stress, and handle social situations. This information is not intended to replace advice given to you by your health care provider. Make sure you discuss any questions you have with your health care provider. Document Released: 08/15/2016 Document Revised: 08/15/2016 Document Reviewed: 08/15/2016 Elsevier Interactive Patient Education  [employer].

## 2017-12-31 NOTE — Progress Notes (Signed)
Chief Complaint  Patient presents with  . recheck medication    wellbutrin, per pt working ok but she is more moodier.    HPI   She reports that the smoking has improved with the wellbutrin She feels more agitated and moody She reports that she might take a puff every once in a while  She usually takes a puff of her boyfriends cigarette but now it taste differently She states that she was previously smoking 1 pack a week  She started at 42 yo intermittently She states that with the exception of maternity leave she would smoke a little every few few days She started Wellbutrin 2 months ago and feels less depressed but reports feeling agitated Her daughter, who is 4th grade, reports that her mother is cranky.  Depression screen [facility] 2/9 12/31/2017 10/21/2017 10/21/2017 01/13/2017 12/08/2016  Decreased Interest 0 2 0 0 1  Down, Depressed, Hopeless 0 2 0 0 1  PHQ - 2 Score 0 4 0 0 2  Altered sleeping - 3 - 0 1  Tired, decreased energy - 3 - 0 1  Change in appetite - 3 - 0 1  Feeling bad or failure about yourself  - 2 - 0 1  Trouble concentrating - 2 - 0 0  Moving slowly or fidgety/restless - 2 - 0 0  Suicidal thoughts - 1 - 0 0  PHQ-9 Score - 20 - 0 6  Difficult doing work/chores - Very difficult - - -     Past Medical History:  Diagnosis Date  . Depression   . Hypertension    history of, no medication, resolved with weight loss    Current Outpatient Medications  Medication Sig Dispense Refill  . buPROPion (WELLBUTRIN SR) 150 MG 12 hr tablet Take 1 tablet (150 mg total) by mouth 2 (two) times daily. 180 tablet 1  . Multiple Vitamin (MULTIVITAMIN WITH MINERALS) TABS tablet Take 1 tablet by mouth daily.    . valACYclovir (VALTREX) 1000 MG tablet Take 1 tablet (1,000 mg total) by mouth daily. For outbreak, half tablet every 12 hours for 3 days.  Then resume prescribed dosing. 90 tablet 3  . busPIRone (BUSPAR) 5 MG tablet Take 1 tablet (5 mg total) by mouth 3 (three) times daily as  needed. 30 tablet 0   No current facility-administered medications for this visit.     Allergies: No Known Allergies  Past Surgical History:  Procedure Laterality Date  . LAPAROSCOPIC TUBAL LIGATION Bilateral 09/27/2015   Procedure: LAPAROSCOPIC TUBAL LIGATION with Anna Genre CLIPS;  Surgeon: Tereso Newcomer, MD;  Location: [facility];  Service: Gynecology;  Laterality: Bilateral;  . MOUTH SURGERY    . TUBAL LIGATION      Social History   Socioeconomic History  . Marital status: Single    Spouse name: Not on file  . Number of children: Not on file  . Years of education: Not on file  . Highest education level: Not on file  Occupational History  . Not on file  Social Needs  . Financial resource strain: Not on file  . Food insecurity:    Worry: Not on file    Inability: Not on file  . Transportation needs:    Medical: Not on file    Non-medical: Not on file  Tobacco Use  . Smoking status: Former Smoker    Packs/day: 0.10    Years: 20.00    Pack years: 2.00    Types: Cigarettes  . Smokeless tobacco: Never  Used  Substance and Sexual Activity  . Alcohol use: Yes    Alcohol/week: 0.6 oz    Types: 1 Standard drinks or equivalent per week    Comment: ocassional  . Drug use: No  . Sexual activity: Yes    Birth control/protection: None  Lifestyle  . Physical activity:    Days per week: Not on file    Minutes per session: Not on file  . Stress: Not on file  Relationships  . Social connections:    Talks on phone: Not on file    Gets together: Not on file    Attends religious service: Not on file    Active member of club or organization: Not on file    Attends meetings of clubs or organizations: Not on file    Relationship status: Not on file  Other Topics Concern  . Not on file  Social History Narrative  . Not on file    Family History  Problem Relation Age of Onset  . Hypertension Mother   . Cancer Mother 56       leukemia  . Cancer Father        hepatic  .  Hyperlipidemia Maternal Grandmother   . Cancer Paternal Grandmother      ROS Review of Systems See HPI Constitution: No fevers or chills No malaise No diaphoresis Skin: No rash or itching Eyes: no blurry vision, no double vision GU: no dysuria or hematuria Neuro: no dizziness or headaches all others reviewed and negative   Objective: Vitals:   12/31/17 1620  BP: 119/82  Pulse: (!) 106  Resp: 16  Temp: 98.3 F (36.8 C)  TempSrc: Oral  SpO2: 98%  Weight: 208 lb 3.2 oz (94.4 kg)  Height: 5' 6.5" (1.689 m)    Physical Exam  Constitutional: She is oriented to person, place, and time. She appears well-developed and well-nourished.  HENT:  Head: Normocephalic and atraumatic.  Eyes: Conjunctivae and EOM are normal.  Cardiovascular: Normal rate, regular rhythm and normal heart sounds.  No murmur heard. Pulmonary/Chest: Effort normal and breath sounds normal. No stridor. No respiratory distress. She has no wheezes.  Neurological: She is alert and oriented to person, place, and time.  Skin: Skin is warm. Capillary refill takes less than 2 seconds.  Psychiatric: She has a normal mood and affect. Her behavior is normal. Judgment and thought content normal.      Assessment and Plan Robyn Ramirez was seen today for recheck medication.  Diagnoses and all orders for this visit:  Other depression- continue wellbutrin which is helping with depressed moods with improvement of the PHQ -     buPROPion (WELLBUTRIN SR) 150 MG 12 hr tablet; Take 1 tablet (150 mg total) by mouth 2 (two) times daily.  Irritability- will add on buspar to help with mood stabilization -     busPIRone (BUSPAR) 5 MG tablet; Take 1 tablet (5 mg total) by mouth 3 (three) times daily as needed.  Encounter for smoking cessation counseling- continue to discuss smoking abstinence Pt doing well the wellbutrin  A total of 25 minutes were spent face-to-face with the patient during this encounter and over half of that  time was spent on counseling and coordination of care. We discussed ways to prevent relapse Discussed mood swings Patient's daughter, a 4th grader, had a few comments but overall pt has good interpersonal relationships    Robyn Ramirez A [name]

## 2018-01-27 ENCOUNTER — Other Ambulatory Visit (HOSPITAL_COMMUNITY)
Admission: RE | Admit: 2018-01-27 | Discharge: 2018-01-27 | Disposition: A | Discharge status: Home / Self Care | Payer: BLUE CROSS/BLUE SHIELD | Source: Ambulatory Visit | Attending: Obstetrics and Gynecology | Admitting: Obstetrics and Gynecology

## 2018-01-27 ENCOUNTER — Ambulatory Visit: Payer: BLUE CROSS/BLUE SHIELD | Admitting: Obstetrics and Gynecology

## 2018-01-27 ENCOUNTER — Encounter: Payer: Self-pay | Admitting: Obstetrics and Gynecology

## 2018-01-27 VITALS — BP 126/70 | HR 84 | Resp 16 | Wt 213.0 lb

## 2018-01-27 DIAGNOSIS — Z01419 Encounter for gynecological examination (general) (routine) without abnormal findings: Secondary | ICD-10-CM | POA: Diagnosis not present

## 2018-01-27 DIAGNOSIS — Z124 Encounter for screening for malignant neoplasm of cervix: Secondary | ICD-10-CM | POA: Diagnosis not present

## 2018-01-27 DIAGNOSIS — N898 Other specified noninflammatory disorders of vagina: Secondary | ICD-10-CM | POA: Diagnosis not present

## 2018-01-27 DIAGNOSIS — Z113 Encounter for screening for infections with a predominantly sexual mode of transmission: Secondary | ICD-10-CM

## 2018-01-27 DIAGNOSIS — Z23 Encounter for immunization: Secondary | ICD-10-CM

## 2018-01-27 DIAGNOSIS — Z7189 Other specified counseling: Secondary | ICD-10-CM | POA: Diagnosis not present

## 2018-01-27 DIAGNOSIS — Z8659 Personal history of other mental and behavioral disorders: Secondary | ICD-10-CM | POA: Diagnosis not present

## 2018-01-27 DIAGNOSIS — Z7185 Encounter for immunization safety counseling: Secondary | ICD-10-CM

## 2018-01-27 NOTE — Patient Instructions (Signed)
EXERCISE AND DIET:  We recommended that you start or continue a regular exercise program for good health. Regular exercise means any activity that makes your heart beat faster and makes you sweat.  We recommend exercising at least 30 minutes per day at least 3 days a week, preferably 4 or 5.  We also recommend a diet low in fat and sugar.  Inactivity, poor dietary choices and obesity can cause diabetes, heart attack, stroke, and kidney damage, among others.    ALCOHOL AND SMOKING:  Women should limit their alcohol intake to no more than 7 drinks/beers/glasses of wine (combined, not each!) per week. Moderation of alcohol intake to this level decreases your risk of breast cancer and liver damage. And of course, no recreational drugs are part of a healthy lifestyle.  And absolutely no smoking or even second hand smoke. Most people know smoking can cause heart and lung diseases, but did you know it also contributes to weakening of your bones? Aging of your skin?  Yellowing of your teeth and nails?  CALCIUM AND VITAMIN D:  Adequate intake of calcium and Vitamin D are recommended.  The recommendations for exact amounts of these supplements seem to change often, but generally speaking 600 mg of calcium (either carbonate or citrate) and 800 units of Vitamin D per day seems prudent. Certain women may benefit from higher intake of Vitamin D.  If you are among these women, your doctor will have told you during your visit.    PAP SMEARS:  Pap smears, to check for cervical cancer or precancers,  have traditionally been done yearly, although recent scientific advances have shown that most women can have pap smears less often.  However, every woman still should have a physical exam from her gynecologist every year. It will include a breast check, inspection of the vulva and vagina to check for abnormal growths or skin changes, a visual exam of the cervix, and then an exam to evaluate the size and shape of the uterus and  ovaries.  And after 40 years of age, a rectal exam is indicated to check for rectal cancers. We will also provide age appropriate advice regarding health maintenance, like when you should have certain vaccines, screening for sexually transmitted diseases, bone density testing, colonoscopy, mammograms, etc.   MAMMOGRAMS:  All women over 40 years old should have a yearly mammogram. Many facilities now offer a "3D" mammogram, which may cost around $50 extra out of pocket. If possible,  we recommend you accept the option to have the 3D mammogram performed.  It both reduces the number of women who will be called back for extra views which then turn out to be normal, and it is better than the routine mammogram at detecting truly abnormal areas.    COLONOSCOPY:  Colonoscopy to screen for colon cancer is recommended for all women at age 50.  We know, you hate the idea of the prep.  We agree, BUT, having colon cancer and not knowing it is worse!!  Colon cancer so often starts as a polyp that can be seen and removed at colonscopy, which can quite literally save your life!  And if your first colonoscopy is normal and you have no family history of colon cancer, most women don't have to have it again for 10 years.  Once every ten years, you can do something that may end up saving your life, right?  We will be happy to help you get it scheduled when you are ready.    Be sure to check your insurance coverage so you understand how much it will cost.  It may be covered as a preventative service at no cost, but you should check your particular policy.      Breast Self-Awareness Breast self-awareness means being familiar with how your breasts look and feel. It involves checking your breasts regularly and reporting any changes to your health care provider. Practicing breast self-awareness is important. A change in your breasts can be a sign of a serious medical problem. Being familiar with how your breasts look and feel allows  you to find any problems early, when treatment is more likely to be successful. All women should practice breast self-awareness, including women who have had breast implants. How to do a breast self-exam One way to learn what is normal for your breasts and whether your breasts are changing is to do a breast self-exam. To do a breast self-exam: Look for Changes  1. Remove all the clothing above your waist. 2. Stand in front of a mirror in a room with good lighting. 3. Put your hands on your hips. 4. Push your hands firmly downward. 5. Compare your breasts in the mirror. Look for differences between them (asymmetry), such as: ? Differences in shape. ? Differences in size. ? Puckers, dips, and bumps in one breast and not the other. 6. Look at each breast for changes in your skin, such as: ? Redness. ? Scaly areas. 7. Look for changes in your nipples, such as: ? Discharge. ? Bleeding. ? Dimpling. ? Redness. ? A change in position. Feel for Changes  Carefully feel your breasts for lumps and changes. It is best to do this while lying on your back on the floor and again while sitting or standing in the shower or tub with soapy water on your skin. Feel each breast in the following way:  Place the arm on the side of the breast you are examining above your head.  Feel your breast with the other hand.  Start in the nipple area and make  inch (2 cm) overlapping circles to feel your breast. Use the pads of your three middle fingers to do this. Apply light pressure, then medium pressure, then firm pressure. The light pressure will allow you to feel the tissue closest to the skin. The medium pressure will allow you to feel the tissue that is a little deeper. The firm pressure will allow you to feel the tissue close to the ribs.  Continue the overlapping circles, moving downward over the breast until you feel your ribs below your breast.  Move one finger-width toward the center of the body.  Continue to use the  inch (2 cm) overlapping circles to feel your breast as you move slowly up toward your collarbone.  Continue the up and down exam using all three pressures until you reach your armpit.  Write Down What You Find  Write down what is normal for each breast and any changes that you find. Keep a written record with breast changes or normal findings for each breast. By writing this information down, you do not need to depend only on memory for size, tenderness, or location. Write down where you are in your menstrual cycle, if you are still menstruating. If you are having trouble noticing differences in your breasts, do not get discouraged. With time you will become more familiar with the variations in your breasts and more comfortable with the exam. How often should I examine my breasts? Examine   your breasts every month. If you are breastfeeding, the best time to examine your breasts is after a feeding or after using a breast pump. If you menstruate, the best time to examine your breasts is 5-7 days after your period is over. During your period, your breasts are lumpier, and it may be more difficult to notice changes. When should I see my health care provider? See your health care provider if you notice:  A change in shape or size of your breasts or nipples.  A change in the skin of your breast or nipples, such as a reddened or scaly area.  Unusual discharge from your nipples.  A lump or thick area that was not there before.  Pain in your breasts.  Anything that concerns you.  This information is not intended to replace advice given to you by your health care provider. Make sure you discuss any questions you have with your health care provider. Document Released: 08/18/2005 Document Revised: 01/24/2016 Document Reviewed: 07/08/2015 Elsevier Interactive Patient Education  2018 [employer]  

## 2018-01-27 NOTE — Progress Notes (Signed)
42 y.o. [id number] SingleAfrican AmericanF here for annual exam. Patient c/o chronic BV.  She had a documented BV infection in 2/19. She currently feels she has BV. She c/o more vaginal moisture, slight odor. No irritation. Current symptoms have been for the last several months. Sexually active, same partner x 2 years. TL for contraception, sometimes uses condoms. No dyspareunia.  H/O HSV, started Valtrex a few months ago, using prn.    Period Cycle (Days): 28 Period Duration (Days): 4-5 days Period Pattern: Regular Menstrual Flow: Moderate Menstrual Control: Thin pad, Maxi pad Menstrual Control Change Freq (Hours): changes pad every 4 hours  Dysmenorrhea: None  Patient's last menstrual period was 01/21/2018.          Sexually active: Yes.    The current method of family planning is tubal ligation.    Exercising: No.  The patient does not participate in regular exercise at present. Smoker:  Former smoker  Health Maintenance: Pap:  12-08-16 ASC-H, NEG HR HPV  History of abnormal Pap:  Yes colposcopy 5/18 CIN I NEG ECC MMG:  Never Colonoscopy:Never   BMD:   Never TDaP:  unsure  Gardasil: no, counseled     reports that she has quit smoking. Her smoking use included cigarettes. She has a 2.00 pack-year smoking history. She has never used smokeless tobacco. She reports that she drinks about 0.6 oz of alcohol per week. She reports that she does not use drugs. 3 kids, 23, 14 and 9. 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   Past Medical History:  Diagnosis Date  . Anxiety   . Depression   . Genital HSV   . Hypertension    history of, no medication, resolved with weight loss  . STD (sexually transmitted disease)    [facility]     Past Surgical History:  Procedure Laterality Date  . LAPAROSCOPIC TUBAL LIGATION Bilateral 09/27/2015   Procedure: LAPAROSCOPIC TUBAL LIGATION with Anna Genre CLIPS;  Surgeon: Tereso Newcomer, MD;  Location: [facility];  Service: Gynecology;  Laterality: Bilateral;  . MOUTH SURGERY     . TUBAL LIGATION      Current Outpatient Medications  Medication Sig Dispense Refill  . buPROPion (WELLBUTRIN SR) 150 MG 12 hr tablet Take 1 tablet (150 mg total) by mouth 2 (two) times daily. 180 tablet 1  . busPIRone (BUSPAR) 5 MG tablet Take 1 tablet (5 mg total) by mouth 3 (three) times daily as needed. 30 tablet 0  . Multiple Vitamin (MULTIVITAMIN WITH MINERALS) TABS tablet Take 1 tablet by mouth daily.    . valACYclovir (VALTREX) 1000 MG tablet Take 1 tablet (1,000 mg total) by mouth daily. For outbreak, half tablet every 12 hours for 3 days.  Then resume prescribed dosing. 90 tablet 3   No current facility-administered medications for this visit.     Family History  Problem Relation Age of Onset  . Hypertension Mother   . Cancer Mother 76       leukemia  . Cancer Father        hepatic  . Hypertension Maternal Grandmother   . Cancer Paternal Grandmother     Review of Systems  Constitutional: Negative.   HENT: Negative.   Eyes: Negative.   Respiratory: Negative.   Cardiovascular: Negative.   Gastrointestinal: Negative.   Endocrine: Negative.   Genitourinary: Positive for vaginal discharge.  Musculoskeletal: Negative.   Skin: Negative.   Allergic/Immunologic: Negative.   Neurological: Negative.   Hematological: Negative.   Psychiatric/Behavioral: Negative.  Exam:   BP 126/70 (BP Location: Right Arm, Patient Position: Sitting, Cuff Size: Normal)   Pulse 84   Resp 16   Wt 213 lb (96.6 kg)   LMP 01/21/2018   BMI 33.86 kg/m   Weight change: @ Height:      Ht Readings from Last 3 Encounters:  12/31/17 5' 6.5" (1.689 m)  10/21/17 5' 6.5" (1.689 m)  01/13/17  (1.727 m)    General appearance: alert, cooperative and appears stated age Head: Normocephalic, without obvious abnormality, atraumatic Neck: no adenopathy, supple, symmetrical, trachea midline and thyroid normal to inspection and palpation Lungs: clear to auscultation  bilaterally Cardiovascular: regular rate and rhythm Breasts: normal appearance, no masses or tenderness Abdomen: soft, non-tender; non distended,  no masses,  no organomegaly Extremities: extremities normal, atraumatic, no cyanosis or edema Skin: Skin color, texture, turgor normal. No rashes or lesions Lymph nodes: Cervical, supraclavicular, and axillary nodes normal. No abnormal inguinal nodes palpated Neurologic: Grossly normal   Pelvic: External genitalia:  no lesions              Urethra:  normal appearing urethra with no masses, tenderness or lesions              Bartholins and Skenes: normal                 Vagina: normal appearing vagina with normal color and discharge, no lesions              Cervix: no lesions               Bimanual Exam:  Uterus:  normal size, contour, position, consistency, mobility, non-tender and anteverted              Adnexa: no mass, fullness, tenderness               Rectovaginal: declines  Chaperone was present for exam.  A:  Well Woman with normal exam  H/O CIN I  C/O recurrent BV. Last documented infection in 2/19    P:   Labs with primary MD, including vit D (not eating foods with vit D)  Pap with hpv  Mammogram  Start gardasil  Check for STD's and BV (declines blood work)  Discussed breast self exam  Discussed calcium and vit D intake  She should come in for evaluation with symptoms of BV, if she has recurrent documented infections will treat with suppression   Recommended use of condoms to decrease risk of BV and transmission of HSV

## 2018-01-29 LAB — CYTOLOGY - PAP
BACTERIAL VAGINITIS: POSITIVE — AB
CHLAMYDIA, DNA PROBE: NEGATIVE
Diagnosis: NEGATIVE
HPV: NOT DETECTED
NEISSERIA GONORRHEA: NEGATIVE
TRICH (WINDOWPATH): NEGATIVE

## 2018-02-02 ENCOUNTER — Telehealth: Payer: Self-pay | Admitting: *Deleted

## 2018-02-02 NOTE — Telephone Encounter (Signed)
Notes recorded by Leda MinHamm, Jasie Meleski N, RN on 02/02/2018 at 3:41 PM EDT Left message to call Noreene LarssonJill at [phone].  02 recall placed ------

## 2018-02-02 NOTE — Telephone Encounter (Signed)
-----   Message from Romualdo BolkJill Evelyn Jertson, MD sent at 02/01/2018 12:57 PM EDT ----- Please inform the patient that her testing was + for BV (as she suspected) and treat with flagyl (either oral or vaginal, her choice), no ETOH while on Flagyl.  Oral: Flagyl 500 mg BID x 7 days, or Vaginal: Metrogel, 1 applicator per vagina q day x 5 days. Her pap, GC/CT/HPV/Trich testing was all normal.  02 recall

## 2018-02-03 MED ORDER — METRONIDAZOLE 500 MG PO TABS: 500.0000 mg | ORAL_TABLET | Freq: Two times a day (BID) | ORAL | 0 refills | Status: DC

## 2018-02-03 NOTE — Telephone Encounter (Signed)
Spoke with patient, advised of results as seen below per Dr. Oscar LaJertson. Rx for flagyl po sent to verified pharmacy on file. ETOH precautions reviewed. Patient verbalizes understanding and is agreeable, will close encounter.

## 2018-03-29 ENCOUNTER — Ambulatory Visit (INDEPENDENT_AMBULATORY_CARE_PROVIDER_SITE_OTHER): Payer: BLUE CROSS/BLUE SHIELD

## 2018-03-29 ENCOUNTER — Telehealth: Payer: Self-pay | Admitting: Obstetrics and Gynecology

## 2018-03-29 VITALS — BP 130/72 | HR 81 | Resp 14 | Ht 66.0 in | Wt 219.2 lb

## 2018-03-29 DIAGNOSIS — Z23 Encounter for immunization: Secondary | ICD-10-CM | POA: Diagnosis not present

## 2018-03-29 NOTE — Progress Notes (Signed)
Patient in today for 2nd Gardasil injection.   Contraception: Tubal Ligation LMP: 03/29/18 Last AEX: 01/27/18 with JJ  Injection given in right deltoid. Patient tolerated shot well.   Patient informed next injection due in about 4 months.Around November 29th  Advised patient, if not on birth control, to return for next injection with cycle.   Routed to provider for final review.  Encounter closed.

## 2018-03-29 NOTE — Telephone Encounter (Signed)
Patient said Dr.Jertson told her to call if she had bacterial vaginosis again. I informed the patient that Dr.Jertson is out of the office this week. Patient is aware that she may need to see a different provider this week.

## 2018-03-29 NOTE — Telephone Encounter (Signed)
Left message to call Curley Hogen at [phone].  

## 2018-03-31 NOTE — Telephone Encounter (Signed)
Spoke with patient, requesting OV woth covering provider for white, vaginal d/c with odor that started 1 wk ago. Patient reports Hx of reccurent BV. Denies any other GYN symptoms or pain.   OV scheduled for 8/1 at 2:45pm with Leota Sauerseborah Leonard, CNM.  Routing to covering provider for final review. Patient is agreeable to disposition. Will close encounter.  Cc: Dr. Oscar LaJertson

## 2018-04-01 ENCOUNTER — Other Ambulatory Visit: Payer: Self-pay

## 2018-04-01 ENCOUNTER — Ambulatory Visit: Payer: BLUE CROSS/BLUE SHIELD | Admitting: Certified Nurse Midwife

## 2018-04-01 ENCOUNTER — Encounter: Payer: Self-pay | Admitting: Certified Nurse Midwife

## 2018-04-01 VITALS — BP 110/72 | HR 70 | Resp 16 | Ht 66.0 in | Wt 218.0 lb

## 2018-04-01 DIAGNOSIS — Z113 Encounter for screening for infections with a predominantly sexual mode of transmission: Secondary | ICD-10-CM | POA: Diagnosis not present

## 2018-04-01 DIAGNOSIS — N898 Other specified noninflammatory disorders of vagina: Secondary | ICD-10-CM | POA: Diagnosis not present

## 2018-04-01 NOTE — Progress Notes (Addendum)
42 y.o. Single African American female [phone] here with complaint of increase white discharge. Describes discharge as frothy with odor..Onset of symptoms 2-4 days ago. Denies new personal products or vaginal dryness. No  STD concerns, but would like screening. Urinary symptoms denies burning with urination. Contraception is tubal ligation. Periods normal, no issues, on last day of cycle. Recent second Gardasil injection,slight tenderness and slight redness only.  Review of Systems  Constitutional: Negative.   HENT: Negative.   Eyes: Negative.   Respiratory: Negative.   Cardiovascular: Negative.   Gastrointestinal: Negative.   Genitourinary: Negative.        Abnormal discharge  Musculoskeletal: Negative.   Skin: Negative.   Neurological: Negative.   Endo/Heme/Allergies: Negative.   Psychiatric/Behavioral: Negative.     O:Healthy female WDWN Affect: normal, orientation x 3  Exam: Skin: warm and dry,numerous tattoos over body Right arm; very slight pink around injection site and minimal swelling/tenderness to touch Abdomen: soft no masses External  Genital: normal female, no lesions, but discharge noted on outside of vulva area to be white thick BUS: negative Vagina: odorous white/yellow discharge noted.  taken, Affirm taken,Gc/Chlamydia taken Cervix: normal, non tender, no CMT Uterus: normal, non tender Adnexa:normal, non tender, no masses or fullness noted   A:Normal pelvic exam Contraception Tubal ligation R/O vaginal infection R/O STD's vaginal only Recent Gardasil vaccine ? Issue with appearance   P:Discussed findings of of vaginal discharge and will treat if indicated once labs in. Discussed Aveeno or baking soda sitz bath for comfort and relief of symptoms while waiting on labs.. Avoid moist clothes or pads for extended period of time. If working out in gym clothes or swim suits for long periods of time change underwear or bottoms of swimsuit if possible. Coconut Oil use  for skin protection prior to activity can be used to external skin for protection or dryness. Questions addressed. Reminded condom use is recommended to prevent STD transmission. Lab:Affirm, Gc/Chlamydia  Reassured patient that the appearance is normal finding with vaccine and she can massage site to decrease tenderness and should resolve soon. Warning signs given.  Rv prn

## 2018-04-02 ENCOUNTER — Encounter: Payer: Self-pay | Admitting: Certified Nurse Midwife

## 2018-04-02 LAB — GC/CHLAMYDIA PROBE AMP
Chlamydia trachomatis, NAA: NEGATIVE
Neisseria gonorrhoeae by PCR: NEGATIVE

## 2018-04-02 LAB — VAGINITIS/VAGINOSIS, DNA PROBE
Candida Species: NEGATIVE
Gardnerella vaginalis: POSITIVE — AB
TRICHOMONAS VAG: NEGATIVE

## 2018-04-03 ENCOUNTER — Other Ambulatory Visit: Payer: Self-pay | Admitting: Certified Nurse Midwife

## 2018-04-03 DIAGNOSIS — N760 Acute vaginitis: Principal | ICD-10-CM

## 2018-04-03 DIAGNOSIS — B9689 Other specified bacterial agents as the cause of diseases classified elsewhere: Secondary | ICD-10-CM

## 2018-04-03 MED ORDER — METRONIDAZOLE 500 MG PO TABS: 500.0000 mg | ORAL_TABLET | Freq: Two times a day (BID) | ORAL | 0 refills | Status: DC

## 2018-04-12 DIAGNOSIS — F430 Acute stress reaction: Secondary | ICD-10-CM | POA: Diagnosis not present

## 2018-04-26 DIAGNOSIS — F430 Acute stress reaction: Secondary | ICD-10-CM | POA: Diagnosis not present

## 2018-05-06 ENCOUNTER — Emergency Department (HOSPITAL_COMMUNITY): Payer: BLUE CROSS/BLUE SHIELD

## 2018-05-06 ENCOUNTER — Other Ambulatory Visit: Payer: Self-pay

## 2018-05-06 ENCOUNTER — Emergency Department (HOSPITAL_COMMUNITY)
Admission: EM | Admit: 2018-05-06 | Discharge: 2018-05-07 | Disposition: A | Discharge status: Home / Self Care | Payer: BLUE CROSS/BLUE SHIELD | Attending: Emergency Medicine | Admitting: Emergency Medicine

## 2018-05-06 ENCOUNTER — Encounter (HOSPITAL_COMMUNITY): Payer: Self-pay | Admitting: Emergency Medicine

## 2018-05-06 DIAGNOSIS — R200 Anesthesia of skin: Secondary | ICD-10-CM | POA: Diagnosis not present

## 2018-05-06 DIAGNOSIS — R202 Paresthesia of skin: Secondary | ICD-10-CM | POA: Diagnosis not present

## 2018-05-06 DIAGNOSIS — R4781 Slurred speech: Secondary | ICD-10-CM | POA: Insufficient documentation

## 2018-05-06 DIAGNOSIS — G4459 Other complicated headache syndrome: Secondary | ICD-10-CM

## 2018-05-06 DIAGNOSIS — I10 Essential (primary) hypertension: Secondary | ICD-10-CM | POA: Insufficient documentation

## 2018-05-06 DIAGNOSIS — Z87891 Personal history of nicotine dependence: Secondary | ICD-10-CM | POA: Insufficient documentation

## 2018-05-06 DIAGNOSIS — R51 Headache: Secondary | ICD-10-CM | POA: Diagnosis not present

## 2018-05-06 LAB — COMPREHENSIVE METABOLIC PANEL
ALBUMIN: 4 g/dL (ref 3.5–5.0)
ALT: 77 U/L — ABNORMAL HIGH (ref 0–44)
ANION GAP: 10 (ref 5–15)
AST: 53 U/L — AB (ref 15–41)
Alkaline Phosphatase: 67 U/L (ref 38–126)
BILIRUBIN TOTAL: 0.6 mg/dL (ref 0.3–1.2)
BUN: 17 mg/dL (ref 6–20)
CHLORIDE: 104 mmol/L (ref 98–111)
CO2: 26 mmol/L (ref 22–32)
Calcium: 9.3 mg/dL (ref 8.9–10.3)
Creatinine, Ser: 1.17 mg/dL — ABNORMAL HIGH (ref 0.44–1.00)
GFR calc Af Amer: >60 mL/min (ref >60)
GFR calc non Af Amer: 57 mL/min — ABNORMAL LOW (ref >60)
GLUCOSE: 92 mg/dL (ref 70–99)
POTASSIUM: 3.2 mmol/L — AB (ref 3.5–5.1)
SODIUM: 140 mmol/L (ref 135–145)
TOTAL PROTEIN: 7.3 g/dL (ref 6.5–8.1)

## 2018-05-06 LAB — DIFFERENTIAL
Abs Immature Granulocytes: 0 10*3/uL (ref 0.0–0.1)
BASOS ABS: 0 10*3/uL (ref 0.0–0.1)
BASOS PCT: 0 %
EOS ABS: 0.1 10*3/uL (ref 0.0–0.7)
EOS PCT: 1 %
IMMATURE GRANULOCYTES: 0 %
LYMPHS ABS: 2.9 10*3/uL (ref 0.7–4.0)
Lymphocytes Relative: 34 %
Monocytes Absolute: 0.7 10*3/uL (ref 0.1–1.0)
Monocytes Relative: 8 %
NEUTROS PCT: 57 %
Neutro Abs: 4.7 10*3/uL (ref 1.7–7.7)

## 2018-05-06 LAB — I-STAT TROPONIN, ED: Troponin i, poc: 0 ng/mL (ref 0.00–0.08)

## 2018-05-06 LAB — I-STAT CHEM 8, ED
BUN: 20 mg/dL (ref 6–20)
CALCIUM ION: 1.15 mmol/L (ref 1.15–1.40)
CHLORIDE: 102 mmol/L (ref 98–111)
Creatinine, Ser: 1.2 mg/dL — ABNORMAL HIGH (ref 0.44–1.00)
GLUCOSE: 91 mg/dL (ref 70–99)
HCT: 39 % (ref 36.0–46.0)
Hemoglobin: 13.3 g/dL (ref 12.0–15.0)
Potassium: 3.1 mmol/L — ABNORMAL LOW (ref 3.5–5.1)
Sodium: 140 mmol/L (ref 135–145)
TCO2: 25 mmol/L (ref 22–32)

## 2018-05-06 LAB — CBC
HEMATOCRIT: 37.9 % (ref 36.0–46.0)
HEMOGLOBIN: 12.4 g/dL (ref 12.0–15.0)
MCH: 30.9 pg (ref 26.0–34.0)
MCHC: 32.7 g/dL (ref 30.0–36.0)
MCV: 94.5 fL (ref 78.0–100.0)
Platelets: 275 10*3/uL (ref 150–400)
RBC: 4.01 MIL/uL (ref 3.87–5.11)
RDW: 12.7 % (ref 11.5–15.5)
WBC: 8.5 10*3/uL (ref 4.0–10.5)

## 2018-05-06 LAB — I-STAT BETA HCG BLOOD, ED (MC, WL, AP ONLY): I-stat hCG, quantitative: <5 m[IU]/mL (ref <5)

## 2018-05-06 LAB — APTT: APTT: 32 s (ref 24–36)

## 2018-05-06 LAB — PROTIME-INR
INR: 0.92
Prothrombin Time: 12.3 seconds (ref 11.4–15.2)

## 2018-05-06 MED ORDER — SODIUM CHLORIDE 0.9 % IV BOLUS: 1000.0000 mL | Freq: Once | INTRAVENOUS | Status: DC

## 2018-05-06 NOTE — ED Triage Notes (Signed)
Pt presents with headache that is R sided and radiates down R side of neck, numbness/tingling to R face and R arm decreased sensation and felt cool/tingling; states she woke up with headache and went to work anyway; came home and daughter told her she looked different; pt slow to answer questions and seems altered

## 2018-05-06 NOTE — ED Notes (Signed)
Pt has delayed answers to questions but answers appropriately

## 2018-05-06 NOTE — ED Notes (Signed)
Pt refused an IV. CAOx4.

## 2018-05-07 ENCOUNTER — Emergency Department (HOSPITAL_COMMUNITY): Payer: BLUE CROSS/BLUE SHIELD

## 2018-05-07 DIAGNOSIS — R200 Anesthesia of skin: Secondary | ICD-10-CM | POA: Diagnosis not present

## 2018-05-07 DIAGNOSIS — R202 Paresthesia of skin: Secondary | ICD-10-CM | POA: Diagnosis not present

## 2018-05-07 LAB — URINALYSIS, ROUTINE W REFLEX MICROSCOPIC
Bilirubin Urine: NEGATIVE
GLUCOSE, UA: NEGATIVE mg/dL
Hgb urine dipstick: NEGATIVE
Ketones, ur: NEGATIVE mg/dL
LEUKOCYTES UA: NEGATIVE
Nitrite: NEGATIVE
PROTEIN: NEGATIVE mg/dL
Specific Gravity, Urine: 1.009 (ref 1.005–1.030)
pH: 8 (ref 5.0–8.0)

## 2018-05-07 LAB — RAPID URINE DRUG SCREEN, HOSP PERFORMED
Amphetamines: NOT DETECTED
BARBITURATES: NOT DETECTED
Benzodiazepines: NOT DETECTED
COCAINE: NOT DETECTED
Opiates: NOT DETECTED
Tetrahydrocannabinol: NOT DETECTED

## 2018-05-07 MED ORDER — KETOROLAC TROMETHAMINE 30 MG/ML IJ SOLN
60.0000 mg | Freq: Once | INTRAMUSCULAR | Status: AC
  Administered 2018-05-07: 60 mg via INTRAMUSCULAR
  Filled 2018-05-07: qty 2

## 2018-05-07 MED ORDER — IOPAMIDOL (ISOVUE-370) INJECTION 76%
100.0000 mL | Freq: Once | INTRAVENOUS | Status: AC | PRN
  Administered 2018-05-07: 90 mL via INTRAVENOUS

## 2018-05-07 MED ORDER — SODIUM CHLORIDE 0.9 % IV BOLUS
1000.0000 mL | Freq: Once | INTRAVENOUS | Status: AC
  Administered 2018-05-07: 1000 mL via INTRAVENOUS

## 2018-05-07 MED ORDER — POTASSIUM CHLORIDE CRYS ER 20 MEQ PO TBCR
20.0000 meq | EXTENDED_RELEASE_TABLET | Freq: Once | ORAL | Status: AC
  Administered 2018-05-07: 20 meq via ORAL
  Filled 2018-05-07: qty 1

## 2018-05-07 MED ORDER — IOPAMIDOL (ISOVUE-370) INJECTION 76%
INTRAVENOUS | Status: AC
  Filled 2018-05-07: qty 100

## 2018-05-07 MED ORDER — KETOROLAC TROMETHAMINE 30 MG/ML IJ SOLN: 30.0000 mg | Freq: Once | INTRAMUSCULAR | Status: DC

## 2018-05-07 MED ORDER — METHOCARBAMOL 500 MG PO TABS: 500.0000 mg | ORAL_TABLET | Freq: Two times a day (BID) | ORAL | 0 refills | Status: DC

## 2018-05-07 NOTE — ED Provider Notes (Signed)
Care assumed from [facility], [place].  Please see her full H&P.  In short,  Robyn Ramirez is a 42 y.o. female presents for headache, right-sided facial numbness and confusion.  Patient reports she woke around 4 AM (which is her normal time for getting up) with a right-sided headache described as throbbing and moderate in intensity that radiates on the right side of her neck.  Patient reports she frequently gets headaches like this and it is common for her neck to hurt as well.  She reports that her neck often feels " tight."  She also states that she intermittently has associated right-sided facial numbness with these headaches.  She has never seen a neurologist.  Night, patient reported to initial provider that she had slowed speech and confusion.  She reports that those symptoms started around 5 PM.  She reports she was able to drive and complete her job tasks but after discussing with her family came to the emergency room for evaluation.  She denies sick contacts, fever, nausea, vomiting, diarrhea, visual changes, numbness, tingling or weakness of any of her extremities.  Patient reports she recently restarted her Wellbutrin and has been taking CBD oil to help her sleep.  Physical Exam  BP 121/86   Pulse 84   Temp 97.8 F (36.6 C)   Resp 15   Ht 5\' 6"  (1.676 m)   Wt 96.6 kg   LMP 04/14/2018   SpO2 99%   BMI 34.38 kg/m   Physical Exam  Constitutional: She is oriented to person, place, and time. She appears well-developed and well-nourished. No distress.  HENT:  Head: Normocephalic.  Eyes: Conjunctivae are normal. No scleral icterus.  Neck: Normal range of motion.  Cardiovascular: Normal rate and intact distal pulses.  Pulmonary/Chest: Effort normal.  Musculoskeletal: Normal range of motion.  Neurological: She is alert and oriented to person, place, and time.  No facial droop Speech is clear and goal oriented; no slurred speech Moves all extremities without ataxia.    Skin:  Skin is warm and dry.  Nursing note and vitals reviewed.   ED Course/Procedures    Ct Angio Head W Or Wo Contrast  Result Date: 05/07/2018 CLINICAL DATA:  Initial evaluation for acute right-sided headache with associated numbness, tingling and right face and upper extremity. EXAM: CT ANGIOGRAPHY HEAD AND NECK CT PERFUSION BRAIN TECHNIQUE: Multidetector CT imaging of the head and neck was performed using the standard protocol during bolus administration of intravenous contrast. Multiplanar CT image reconstructions and MIPs were obtained to evaluate the vascular anatomy. Carotid stenosis measurements (when applicable) are obtained utilizing NASCET criteria, using the distal internal carotid diameter as the denominator. Multiphase CT imaging of the brain was performed following IV bolus contrast injection. Subsequent parametric perfusion maps were calculated using RAPID software. CONTRAST:  90mL ISOVUE-370 IOPAMIDOL (ISOVUE-370) INJECTION 76% COMPARISON:  Prior noncontrast CT from earlier the same day. FINDINGS: CTA NECK FINDINGS Aortic arch: Right-sided aortic arch with mirror branching. Visualized arch of normal caliber. No flow-limiting stenosis about the origin of the great vessels. Visualized subclavian arteries widely patent. Right carotid system: Right common and internal carotid arteries widely patent without stenosis, dissection, or occlusion. Left carotid system: Left common and internal carotid arteries widely patent without stenosis, dissection, or occlusion. Vertebral arteries: Both of the vertebral arteries arise from the subclavian arteries. Vertebral arteries widely patent within the neck without stenosis, dissection, or occlusion. Skeleton: No acute osseus abnormality. No discrete lytic or blastic osseous lesions. Other neck: No other  acute soft tissue abnormality within the neck. Salivary glands within normal limits. Normal thyroid. No adenopathy. Upper chest: Visualized upper chest  demonstrates no acute finding. Emphysema noted. Review of the MIP images confirms the above findings CTA HEAD FINDINGS Anterior circulation: Internal carotid arteries widely patent to the termini without stenosis. A1 segments, anterior communicating artery common anterior cerebral arteries patent bilaterally. No M1 stenosis or occlusion. Distal MCA branches well perfused and symmetric. Posterior circulation: Vertebral arteries widely patent to the vertebrobasilar junction. Posterior inferior cerebral arteries not visualized on this exam. Basilar artery widely patent to its distal aspect. Superior cerebellar and posterior cerebral arteries patent bilaterally. Venous sinuses: Grossly patent, although not well assessed due to timing of contrast bolus. Anatomic variants: None significant. Delayed phase: Not performed. Review of the MIP images confirms the above findings CT Brain Perfusion Findings: CBF (<30%) Volume: 0mL Perfusion (Tmax>6.0s) volume: 0mL Mismatch Volume: 0mL Infarction Location:No acute infarct by CT perfusion. IMPRESSION: 1. Negative CTA of the head and neck. No large vessel occlusion or other acute vascular abnormality identified. No hemodynamically significant or correctable stenosis. 2. Negative CT perfusion.  No evidence for acute infarct. Electronically Signed   By: Rise Mu M.D.   On: 05/07/2018 04:31   Ct Head Wo Contrast  Result Date: 05/06/2018 CLINICAL DATA:  Right-sided headache radiating down the right neck. Numbness and tingling to the right face and right arm. EXAM: CT HEAD WITHOUT CONTRAST TECHNIQUE: Contiguous axial images were obtained from the base of the skull through the vertex without intravenous contrast. COMPARISON:  None. FINDINGS: Brain: No evidence of acute infarction, hemorrhage, hydrocephalus, extra-axial collection or mass lesion/mass effect. Vascular: No hyperdense vessel or unexpected calcification. Skull: Normal. Negative for fracture or focal lesion.  Sinuses/Orbits: Probable retention cyst in the left maxillary antrum. Paranasal sinuses and mastoid air cells are otherwise clear. No acute air-fluid levels. Other: None. IMPRESSION: No acute intracranial abnormality. Electronically Signed   By: Burman Nieves M.D.   On: 05/06/2018 22:07   Ct Angio Neck W Or Wo Contrast  Result Date: 05/07/2018 CLINICAL DATA:  Initial evaluation for acute right-sided headache with associated numbness, tingling and right face and upper extremity. EXAM: CT ANGIOGRAPHY HEAD AND NECK CT PERFUSION BRAIN TECHNIQUE: Multidetector CT imaging of the head and neck was performed using the standard protocol during bolus administration of intravenous contrast. Multiplanar CT image reconstructions and MIPs were obtained to evaluate the vascular anatomy. Carotid stenosis measurements (when applicable) are obtained utilizing NASCET criteria, using the distal internal carotid diameter as the denominator. Multiphase CT imaging of the brain was performed following IV bolus contrast injection. Subsequent parametric perfusion maps were calculated using RAPID software. CONTRAST:  90mL ISOVUE-370 IOPAMIDOL (ISOVUE-370) INJECTION 76% COMPARISON:  Prior noncontrast CT from earlier the same day. FINDINGS: CTA NECK FINDINGS Aortic arch: Right-sided aortic arch with mirror branching. Visualized arch of normal caliber. No flow-limiting stenosis about the origin of the great vessels. Visualized subclavian arteries widely patent. Right carotid system: Right common and internal carotid arteries widely patent without stenosis, dissection, or occlusion. Left carotid system: Left common and internal carotid arteries widely patent without stenosis, dissection, or occlusion. Vertebral arteries: Both of the vertebral arteries arise from the subclavian arteries. Vertebral arteries widely patent within the neck without stenosis, dissection, or occlusion. Skeleton: No acute osseus abnormality. No discrete lytic or  blastic osseous lesions. Other neck: No other acute soft tissue abnormality within the neck. Salivary glands within normal limits. Normal thyroid. No adenopathy. Upper chest: Visualized  upper chest demonstrates no acute finding. Emphysema noted. Review of the MIP images confirms the above findings CTA HEAD FINDINGS Anterior circulation: Internal carotid arteries widely patent to the termini without stenosis. A1 segments, anterior communicating artery common anterior cerebral arteries patent bilaterally. No M1 stenosis or occlusion. Distal MCA branches well perfused and symmetric. Posterior circulation: Vertebral arteries widely patent to the vertebrobasilar junction. Posterior inferior cerebral arteries not visualized on this exam. Basilar artery widely patent to its distal aspect. Superior cerebellar and posterior cerebral arteries patent bilaterally. Venous sinuses: Grossly patent, although not well assessed due to timing of contrast bolus. Anatomic variants: None significant. Delayed phase: Not performed. Review of the MIP images confirms the above findings CT Brain Perfusion Findings: CBF (<30%) Volume: 0mL Perfusion (Tmax>6.0s) volume: 0mL Mismatch Volume: 0mL Infarction Location:No acute infarct by CT perfusion. IMPRESSION: 1. Negative CTA of the head and neck. No large vessel occlusion or other acute vascular abnormality identified. No hemodynamically significant or correctable stenosis. 2. Negative CT perfusion.  No evidence for acute infarct. Electronically Signed   By: Rise Mu M.D.   On: 05/07/2018 04:31   Ct Cerebral Perfusion W Contrast  Result Date: 05/07/2018 CLINICAL DATA:  Initial evaluation for acute right-sided headache with associated numbness, tingling and right face and upper extremity. EXAM: CT ANGIOGRAPHY HEAD AND NECK CT PERFUSION BRAIN TECHNIQUE: Multidetector CT imaging of the head and neck was performed using the standard protocol during bolus administration of intravenous  contrast. Multiplanar CT image reconstructions and MIPs were obtained to evaluate the vascular anatomy. Carotid stenosis measurements (when applicable) are obtained utilizing NASCET criteria, using the distal internal carotid diameter as the denominator. Multiphase CT imaging of the brain was performed following IV bolus contrast injection. Subsequent parametric perfusion maps were calculated using RAPID software. CONTRAST:  90mL ISOVUE-370 IOPAMIDOL (ISOVUE-370) INJECTION 76% COMPARISON:  Prior noncontrast CT from earlier the same day. FINDINGS: CTA NECK FINDINGS Aortic arch: Right-sided aortic arch with mirror branching. Visualized arch of normal caliber. No flow-limiting stenosis about the origin of the great vessels. Visualized subclavian arteries widely patent. Right carotid system: Right common and internal carotid arteries widely patent without stenosis, dissection, or occlusion. Left carotid system: Left common and internal carotid arteries widely patent without stenosis, dissection, or occlusion. Vertebral arteries: Both of the vertebral arteries arise from the subclavian arteries. Vertebral arteries widely patent within the neck without stenosis, dissection, or occlusion. Skeleton: No acute osseus abnormality. No discrete lytic or blastic osseous lesions. Other neck: No other acute soft tissue abnormality within the neck. Salivary glands within normal limits. Normal thyroid. No adenopathy. Upper chest: Visualized upper chest demonstrates no acute finding. Emphysema noted. Review of the MIP images confirms the above findings CTA HEAD FINDINGS Anterior circulation: Internal carotid arteries widely patent to the termini without stenosis. A1 segments, anterior communicating artery common anterior cerebral arteries patent bilaterally. No M1 stenosis or occlusion. Distal MCA branches well perfused and symmetric. Posterior circulation: Vertebral arteries widely patent to the vertebrobasilar junction. Posterior  inferior cerebral arteries not visualized on this exam. Basilar artery widely patent to its distal aspect. Superior cerebellar and posterior cerebral arteries patent bilaterally. Venous sinuses: Grossly patent, although not well assessed due to timing of contrast bolus. Anatomic variants: None significant. Delayed phase: Not performed. Review of the MIP images confirms the above findings CT Brain Perfusion Findings: CBF (<30%) Volume: 0mL Perfusion (Tmax>6.0s) volume: 0mL Mismatch Volume: 0mL Infarction Location:No acute infarct by CT perfusion. IMPRESSION: 1. Negative CTA of the head and  neck. No large vessel occlusion or other acute vascular abnormality identified. No hemodynamically significant or correctable stenosis. 2. Negative CT perfusion.  No evidence for acute infarct. Electronically Signed   By: Rise Mu M.D.   On: 05/07/2018 04:31     Clinical Course as of May 08 607  [facility] May 07, 2018  0142 On reassessment patient resting comfortably no acute distress at this time.  Sleeping, easily arousable.   Patient's speech is markedly improved at this time patient speaking at close to normal speed at this time. Patient's right hand grip strength has greatly improved, now equal in strength compared to left.  Patient still endorsing paresthesias along right V2 distribution of trigeminal nerve.  Patient denies any pain at this time, states she is feeling well.   [BM]  0234 Consult to Neurology Dr. Otelia Limes who advises CTA head and neck with perfusion to replace MRI. Dr. Otelia Limes will see patient post CTA, most likely d/c if normal study.   [BM]  0607 Reports complete resolution of her feeling of confusion.  She has some persistent numbness to the right side of her face and some persistent headache.  She is ambulatory here in the emergency department without difficulty.  Steady gait.  She is alert, oriented x3 and engaged in conversation.  She is not slow to respond.  She denies all drug and  alcohol usage.  Her drug screen is negative.   [HM]    Clinical Course User Index [BM] Bill Salinas, PA-C [HM] Paisyn Guercio, Dahlia Client, PA-C    Procedures  MDM   Pt presents with headache, facial numbness and complaint of confusion.  Pt alert and speaking normally to me at signs out.  Pt was discussed between Harlene Salts, PA-C and Dr. Otelia Limes.  As pt is unable to have MRI due to permanent piercing.  Dr. Otelia Limes recommends CTA head/neck.    CT/CTA/CTP negative for acute abnormality.  Pt is well appearing on my exam.  Slightly decreased sensations along the right temporal region.  Speech normal. Pt A&Ox3.  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  Will give Toradol and pt will follow-up with neurology.    Complicated headache syndromes       Coyle Stordahl, Boyd Kerbs 05/07/18 [id number]    Nira Conn, MD 05/07/18 [phone]

## 2018-05-07 NOTE — Discharge Instructions (Signed)
1. Medications: ibuprofen for headache, robaxin for muscle spasms, usual home medications 2. Treatment: rest, drink plenty of fluids,  3. Follow Up: Please followup with your primary doctor and/or neurology in 2-3 days for discussion of your diagnoses and further evaluation after today's visit; if you do not have a primary care doctor use the resource guide provided to find one; Please return to the ER for return of symptoms, new or worsening symptoms

## 2018-05-07 NOTE — ED Provider Notes (Addendum)
[facility] EMERGENCY DEPARTMENT Provider Note   CSN: [id number] Arrival date & time: 05/06/18  2046     History   Chief Complaint Chief Complaint  Patient presents with  . Numbness  . Altered Mental Status    HPI Robyn Ramirez is a 42 y.o. female presenting for headache, facial numbness and slowed speech..  Patient states that she awoke at 4 AM with a right-sided headache that she describes as throbbing and moderate in intensity that radiated down to the right side of her neck.  She states that she then took on Wellbutrin and returned to sleep.  Patient states that when she woke up approximately 9 AM she had right-sided facial numbness and slowed speaking. Patient states that headache, facial numbness and slowed speech continued until her family convinced her to present to the emergency department this afternoon.  Upon arrival patient was noted to have slowed speech by nursing staff and patient was taken for CT.  On my initial evaluation after return for CT patient sitting comfortably in bed, no acute distress. Patient denying headache however still endorsing right sided facial numbness. Patient denies fever, nausea/vomiting, diarrhea, recent illness, visual changes, rash.  Of note patient states that patient states she began taking CBD to help with her sleep approximately 1 week ago she states that she has only use this a few times before and took it last night before she went to bed.  Patient denies other drug use.  Patient states that she has had migraines in the past however has not had symptoms of facial numbness or trouble speaking before.  HPI  Past Medical History:  Diagnosis Date  . Anxiety   . Depression   . Genital HSV   . Hypertension    history of, no medication, resolved with weight loss  . STD (sexually transmitted disease)    [facility]     Patient Active Problem List   Diagnosis Date Noted  . Atypical squamous cells cannot exclude high  grade squamous intraepithelial lesion on cytologic smear of cervix (ASC-H) 12/08/2016    Past Surgical History:  Procedure Laterality Date  . LAPAROSCOPIC TUBAL LIGATION Bilateral 09/27/2015   Procedure: LAPAROSCOPIC TUBAL LIGATION with Anna Genre CLIPS;  Surgeon: Tereso Newcomer, MD;  Location: [facility];  Service: Gynecology;  Laterality: Bilateral;  . MOUTH SURGERY    . TUBAL LIGATION       OB History    Gravida  8   Para  3   Term  3   Preterm      AB  5   Living  3     SAB  1   TAB  4   Ectopic      Multiple      Live Births               Home Medications    Prior to Admission medications   Medication Sig Start Date End Date Taking? Authorizing Provider  buPROPion HCl (WELLBUTRIN PO) Take 1 tablet by mouth 2 (two) times daily.   Yes [provider]  metroNIDAZOLE (FLAGYL) 500 MG tablet Take 1 tablet (500 mg total) by mouth 2 (two) times daily. Avoid alcohol during treatment Patient not taking: Reported on 05/06/2018 04/03/18   Verner Chol, CNM  valACYclovir (VALTREX) 1000 MG tablet Take 1 tablet (1,000 mg total) by mouth daily. For outbreak, half tablet every 12 hours for 3 days.  Then resume prescribed dosing. Patient not taking: Reported on 05/06/2018  11/10/17   Garnetta Buddy, PA    Family History Family History  Problem Relation Age of Onset  . Hypertension Mother   . Cancer Mother 48       leukemia  . Cancer Father        hepatic  . Hypertension Maternal Grandmother   . Cancer Paternal Grandmother     Social History Social History   Tobacco Use  . Smoking status: Former Smoker    Packs/day: 0.10    Years: 20.00    Pack years: 2.00    Types: Cigarettes  . Smokeless tobacco: Never Used  Substance Use Topics  . Alcohol use: Not Currently  . Drug use: No     Allergies   Patient has no known allergies.   Review of Systems Review of Systems  Constitutional: Negative.  Negative for chills and fever.  HENT: Negative.   Negative for rhinorrhea and sore throat.   Eyes: Negative.  Negative for visual disturbance.  Respiratory: Negative.  Negative for cough and shortness of breath.   Cardiovascular: Negative.  Negative for chest pain.  Gastrointestinal: Negative.  Negative for abdominal pain, blood in stool, diarrhea, nausea and vomiting.  Genitourinary: Negative.  Negative for dysuria, hematuria, vaginal bleeding and vaginal discharge.  Musculoskeletal: Positive for neck pain. Negative for arthralgias and myalgias.  Skin: Negative.  Negative for rash.  Neurological: Positive for speech difficulty, numbness and headaches. Negative for dizziness, syncope and weakness.   Physical Exam Updated Vital Signs BP 121/86   Pulse 84   Temp 97.8 F (36.6 C)   Resp 15   Ht 5\' 6"  (1.676 m)   Wt 96.6 kg   LMP 04/14/2018   SpO2 99%   BMI 34.38 kg/m   Physical Exam  Constitutional: She is oriented to person, place, and time. She appears well-developed and well-nourished. No distress.  Tired appearing  HENT:  Head: Normocephalic and atraumatic.    Right Ear: Hearing, tympanic membrane, external ear and ear canal normal.  Left Ear: Hearing, tympanic membrane, external ear and ear canal normal.  Nose: Nose normal.  Mouth/Throat: Oropharynx is clear and moist.  Paresthesias to right face approximate right trigeminal V2 distribution.  Eyes: Pupils are equal, round, and reactive to light. Conjunctivae and EOM are normal.  Neck: Trachea normal, normal range of motion, full passive range of motion without pain and phonation normal. Neck supple. No tracheal deviation present.  Cardiovascular: Normal rate, regular rhythm, normal heart sounds and intact distal pulses.  Pulmonary/Chest: Effort normal and breath sounds normal. No respiratory distress. She has no wheezes.  Abdominal: Soft. There is no tenderness. There is no rebound and no guarding.  Musculoskeletal: Normal range of motion. She exhibits no edema or  tenderness.  Neurological: She is oriented to person, place, and time. A sensory deficit is present. GCS eye subscore is 4. GCS verbal subscore is 5. GCS motor subscore is 6.  Mental Status: Alert, oriented, thought content appropriate, able to give a coherent history. Patient with slow speech/response to questions. Able to follow 2 step commands without difficulty. Cranial Nerves: II: Peripheral visual fields grossly normal, pupils equal, round, reactive to light III,IV, VI: ptosis not present, extra-ocular motions intact bilaterally V,VII: smile symmetric, eyebrows raise symmetric, facial light touch sensation decreased in right V2 distribution VIII: hearing grossly normal to voice X: uvula elevates symmetrically XI: bilateral shoulder shrug symmetric and strong XII: midline tongue extension without fassiculations Motor: Normal tone.  Diminished grip strength to right upper  extremity, 3/5 compared to 5/5 in left upper extremity 5/5 strength in lower extremities bilaterally including strong and equal dorsiflexion/plantar flexion Sensory: Sensation intact to light touch in all extremities. Cerebellar: normal finger-to-nose with bilateral upper extremities. Normal heel-to -shin balance bilaterally of the lower extremity. No pronator drift.  CV: distal pulses palpable throughout  Skin: Skin is warm and dry. Capillary refill takes less than 2 seconds.  Psychiatric: She has a normal mood and affect. Her behavior is normal.   ED Treatments / Results  Labs (all labs ordered are listed, but only abnormal results are displayed) Labs Reviewed  COMPREHENSIVE METABOLIC PANEL - Abnormal; Notable for the following components:      Result Value   Potassium 3.2 (*)    Creatinine, Ser 1.17 (*)    AST 53 (*)    ALT 77 (*)    GFR calc non Af Amer 57 (*)    All other components within normal limits  URINALYSIS, ROUTINE W REFLEX MICROSCOPIC - Abnormal; Notable for the following components:    Color, Urine STRAW (*)    All other components within normal limits  I-STAT CHEM 8, ED - Abnormal; Notable for the following components:   Potassium 3.1 (*)    Creatinine, Ser 1.20 (*)    All other components within normal limits  CBC  PROTIME-INR  APTT  DIFFERENTIAL  RAPID URINE DRUG SCREEN, HOSP PERFORMED  CBG MONITORING, ED  I-STAT BETA HCG BLOOD, ED ([facility], [facility], [facility] ONLY)  I-STAT TROPONIN, ED  CBG MONITORING, ED  I-STAT BETA HCG BLOOD, ED ([facility], [facility], [facility] ONLY)    EKG EKG Interpretation  Date/Time:  Thursday May 06 2018 21:13:42 EDT Ventricular Rate:  79 PR Interval:  134 QRS Duration: 88 QT Interval:  370 QTC Calculation: 424 R Axis:   81 Text Interpretation:  Normal sinus rhythm Normal ECG Confirmed by Cathren Laine ([id number]) on 05/06/2018 10:44:20 PM   Radiology Ct Head Wo Contrast  Result Date: 05/06/2018 CLINICAL DATA:  Right-sided headache radiating down the right neck. Numbness and tingling to the right face and right arm. EXAM: CT HEAD WITHOUT CONTRAST TECHNIQUE: Contiguous axial images were obtained from the base of the skull through the vertex without intravenous contrast. COMPARISON:  None. FINDINGS: Brain: No evidence of acute infarction, hemorrhage, hydrocephalus, extra-axial collection or mass lesion/mass effect. Vascular: No hyperdense vessel or unexpected calcification. Skull: Normal. Negative for fracture or focal lesion. Sinuses/Orbits: Probable retention cyst in the left maxillary antrum. Paranasal sinuses and mastoid air cells are otherwise clear. No acute air-fluid levels. Other: None. IMPRESSION: No acute intracranial abnormality. Electronically Signed   By: Burman Nieves M.D.   On: 05/06/2018 22:07    Procedures Procedures (including critical care time)  Medications Ordered in ED Medications  sodium chloride 0.9 % bolus 1,000 mL (1,000 mLs Intravenous New Bag/Given 05/07/18 0203)  potassium chloride SA (K-DUR,KLOR-CON) CR tablet 20 mEq (20 mEq Oral Given  05/07/18 0158)   Initial Impression / Assessment and Plan / ED Course  I have reviewed the triage vital signs and the nursing notes.  Pertinent labs & imaging results that were available during my care of the patient were reviewed by me and considered in my medical decision making (see chart for details).  Clinical Course as of May 10 1525  Fri May 07, 2018  0142 On reassessment patient resting comfortably no acute distress at this time.  Sleeping, easily arousable.   Patient's speech is markedly improved at this time patient speaking at  close to normal speed at this time. Patient's right hand grip strength has greatly improved, now equal in strength compared to left.  Patient still endorsing paresthesias along right V2 distribution of trigeminal nerve.  Patient denies any pain at this time, states she is feeling well.   [BM]  0234 Consult to Neurology Dr. Otelia Limes who advises CTA head and neck with perfusion to replace MRI. Dr. Otelia Limes will see patient post CTA, most likely d/c if normal study.   [BM]  0607 Reports complete resolution of her feeling of confusion.  She has some persistent numbness to the right side of her face and some persistent headache.  She is ambulatory here in the emergency department without difficulty.  Steady gait.  She is alert, oriented x3 and engaged in conversation.  She is not slow to respond.  She denies all drug and alcohol usage.  Her drug screen is negative.   [HM]    Clinical Course User Index [BM] Bill Salinas, PA-C [HM] [place], Dahlia Client, [place]   Patient presenting with greater than 12 hours of slowed speech, right CN V2 facial numbness and right upper extremity decreased grip.    CT head negative for acute findings EKG without acute changes CMP shows creatinine of 1.17 and decreased potassium of 3.2 CBC within normal limits PT/INR within normal limits APTT within normal limits Beta-hCG negative Urinalysis within normal limits UDS  negative MRI pending  Patient also seen and evaluated by Dr. Denton Lank who advises MRI brain, pending normal MRI discharge with neurology follow-up.  Patient initially refused IV, I have spoken to the patient and she is agreeable to IV fluids at this time as well as to potassium supplementation.  Care handoff given to [facility] PA-C at shift change. ------------------------------------------------------------------------------------------ Informed by nursing staff that patient's piercings are imbedded and cannot be removed. Patient is unable to undergo MRI. Consult to Neurology ordered.  Consult to Neurology Dr. Otelia Limes advises CTA head and neck with perfusion to replace MRI. Dr. Otelia Limes to see patient post CTA, most likely d/c if normal study.  CTAs with perfusion ordered. Patient aware of care plan and is agreeable.  Care handoff given to [facility] PA-C at shift change.  Portions of this report may have been transcribed using voice recognition software. Every effort was made to ensure accuracy; however, inadvertent computerized transcription errors may still be present.  Final Clinical Impressions(s) / ED Diagnoses   Final diagnoses:  None    ED Discharge Orders    None       Bill Salinas, PA-C 05/07/18 0251    Bill Salinas, PA-C 05/10/18 1526    Cathren Laine, MD 05/12/18 Rickey Primus

## 2018-05-18 ENCOUNTER — Ambulatory Visit (INDEPENDENT_AMBULATORY_CARE_PROVIDER_SITE_OTHER): Payer: BLUE CROSS/BLUE SHIELD | Admitting: Certified Nurse Midwife

## 2018-05-18 ENCOUNTER — Encounter: Payer: Self-pay | Admitting: Certified Nurse Midwife

## 2018-05-18 ENCOUNTER — Other Ambulatory Visit: Payer: Self-pay

## 2018-05-18 VITALS — BP 108/70 | HR 70 | Resp 16 | Ht 66.0 in | Wt 219.0 lb

## 2018-05-18 DIAGNOSIS — N898 Other specified noninflammatory disorders of vagina: Secondary | ICD-10-CM

## 2018-05-18 DIAGNOSIS — H938X3 Other specified disorders of ear, bilateral: Secondary | ICD-10-CM

## 2018-05-18 NOTE — Patient Instructions (Signed)
Bacterial Vaginosis Bacterial vaginosis is a vaginal infection that occurs when the normal balance of bacteria in the vagina is disrupted. It results from an overgrowth of certain bacteria. This is the most common vaginal infection among women ages 15-44. Because bacterial vaginosis increases your risk for STIs (sexually transmitted infections), getting treated can help reduce your risk for chlamydia, gonorrhea, herpes, and HIV (human immunodeficiency virus). Treatment is also important for preventing complications in pregnant women, because this condition can cause an early (premature) delivery. What are the causes? This condition is caused by an increase in harmful bacteria that are normally present in small amounts in the vagina. However, the reason that the condition develops is not fully understood. What increases the risk? The following factors may make you more likely to develop this condition:  Having a new sexual partner or multiple sexual partners.  Having unprotected sex.  Douching.  Having an intrauterine device (IUD).  Smoking.  Drug and alcohol abuse.  Taking certain antibiotic medicines.  Being pregnant.  You cannot get bacterial vaginosis from toilet seats, bedding, swimming pools, or contact with objects around you. What are the signs or symptoms? Symptoms of this condition include:  Grey or white vaginal discharge. The discharge can also be watery or foamy.  A fish-like odor with discharge, especially after sexual intercourse or during menstruation.  Itching in and around the vagina.  Burning or pain with urination.  Some women with bacterial vaginosis have no signs or symptoms. How is this diagnosed? This condition is diagnosed based on:  Your medical history.  A physical exam of the vagina.  Testing a sample of vaginal fluid under a microscope to look for a large amount of bad bacteria or abnormal cells. Your health care provider may use a cotton swab  or a small wooden spatula to collect the sample.  How is this treated? This condition is treated with antibiotics. These may be given as a pill, a vaginal cream, or a medicine that is put into the vagina (suppository). If the condition comes back after treatment, a second round of antibiotics may be needed. Follow these instructions at home: Medicines  Take over-the-counter and prescription medicines only as told by your health care provider.  Take or use your antibiotic as told by your health care provider. Do not stop taking or using the antibiotic even if you start to feel better. General instructions  If you have a female sexual partner, tell her that you have a vaginal infection. She should see her health care provider and be treated if she has symptoms. If you have a female sexual partner, he does not need treatment.  During treatment: ? Avoid sexual activity until you finish treatment. ? Do not douche. ? Avoid alcohol as directed by your health care provider. ? Avoid breastfeeding as directed by your health care provider.  Drink enough water and fluids to keep your urine clear or pale yellow.  Keep the area around your vagina and rectum clean. ? Wash the area daily with warm water. ? Wipe yourself from front to back after using the toilet.  Keep all follow-up visits as told by your health care provider. This is important. How is this prevented?  Do not douche.  Wash the outside of your vagina with warm water only.  Use protection when having sex. This includes latex condoms and dental dams.  Limit how many sexual partners you have. To help prevent bacterial vaginosis, it is best to have sex with just   one partner (monogamous).  Make sure you and your sexual partner are tested for STIs.  Wear cotton or cotton-lined underwear.  Avoid wearing tight pants and pantyhose, especially during summer.  Limit the amount of alcohol that you drink.  Do not use any products that  contain nicotine or tobacco, such as cigarettes and e-cigarettes. If you need help quitting, ask your health care provider.  Do not use illegal drugs. Where to find more information:  Centers for Disease Control and Prevention: www.cdc.gov/std  American Sexual Health Association (ASHA): www.ashastd.org  U.S. Department of Health and Human Services, Office on Women's Health: www.womenshealth.gov/ or https://www.womenshealth.gov/a-z-topics/bacterial-vaginosis Contact a health care provider if:  Your symptoms do not improve, even after treatment.  You have more discharge or pain when urinating.  You have a fever.  You have pain in your abdomen.  You have pain during sex.  You have vaginal bleeding between periods. Summary  Bacterial vaginosis is a vaginal infection that occurs when the normal balance of bacteria in the vagina is disrupted.  Because bacterial vaginosis increases your risk for STIs (sexually transmitted infections), getting treated can help reduce your risk for chlamydia, gonorrhea, herpes, and HIV (human immunodeficiency virus). Treatment is also important for preventing complications in pregnant women, because the condition can cause an early (premature) delivery.  This condition is treated with antibiotic medicines. These may be given as a pill, a vaginal cream, or a medicine that is put into the vagina (suppository). This information is not intended to replace advice given to you by your health care provider. Make sure you discuss any questions you have with your health care provider. Document Released: 08/18/2005 Document Revised: 12/22/2016 Document Reviewed: 05/03/2016 Elsevier Interactive Patient Education  2018 [employer]  

## 2018-05-18 NOTE — Progress Notes (Signed)
42 y.o. Single African American female [phone] here with complaint of vaginal symptoms of just white odorous increased discharge. Menses present now, normal, but had odorous discharge prior to onset. No partner change or STD concerns.Onset of symptoms 21 days ago. Denies new personal products or vaginal dryness. Long history of BV and treatment. Tired of reoccurrence.  Urinary symptoms none . Contraception BTL. Does not have this problem if condoms are used, but does like to use condoms. Has never used Boric acid to try to decrease frequency of BV.  Also feels like she cannot hear out both ears due to cold, please check. No other health issues today.  Review of Systems  Constitutional: Negative.   HENT: Negative.   Eyes: Negative.   Respiratory: Negative.   Cardiovascular: Negative.   Gastrointestinal: Negative.   Genitourinary:       Vaginal discharge  Musculoskeletal: Negative.   Skin: Negative.   Neurological: Negative.   Endo/Heme/Allergies: Negative.   Psychiatric/Behavioral: Negative.     O:Healthy female WDWN Affect: normal, orientation x 3  Exam:Skin: warm and dry Left ear: no redness, slight distention of membrane, no wax present, Right ear: clear with slight exudate noted in canal, no wax, slight increase pink Abdomen: soft, non tender, no masses  Inguinal Lymph nodes: no enlargement or tenderness Pelvic exam: External genital: normal female, no lesions BUS: negative Vagina: period present with red bloody discharge noted.  Affirm taken Cervix: normal, non tender, no CMT Uterus: normal, non tender Adnexa:normal, non tender, no masses or fullness noted    A:Normal pelvic exam R/O vaginal infection Chronic history of BV Cold with congestion, no ear infection noted   P:Discussed findings of normal pelvic exam and etiology. Discussed Aveeno or baking soda sitz bath for comfort. Discussed treatment as indicated by affirm. Discussed Boric acid regimen to stop chronic  occurrence or chronic treatment regimen with Metrogel. Patient may consider, but will do her research. Questions addressed. Lab: Affirm Discussed ear findings and suggested salt water inhalation, OTC Mucinex per instructions. Warning signs given with ear issues.  Rv prn

## 2018-05-19 LAB — VAGINITIS/VAGINOSIS, DNA PROBE
CANDIDA SPECIES: NEGATIVE
Gardnerella vaginalis: POSITIVE — AB
Trichomonas vaginosis: NEGATIVE

## 2018-05-20 ENCOUNTER — Other Ambulatory Visit: Payer: Self-pay

## 2018-05-20 MED ORDER — METRONIDAZOLE 500 MG PO TABS: 500.0000 mg | ORAL_TABLET | Freq: Two times a day (BID) | ORAL | 0 refills | Status: DC

## 2018-06-09 ENCOUNTER — Encounter: Payer: Self-pay | Admitting: Emergency Medicine

## 2018-06-09 ENCOUNTER — Ambulatory Visit (INDEPENDENT_AMBULATORY_CARE_PROVIDER_SITE_OTHER): Payer: BLUE CROSS/BLUE SHIELD | Admitting: Emergency Medicine

## 2018-06-09 VITALS — BP 124/80 | HR 82 | Temp 98.4°F | Resp 17 | Ht 66.0 in | Wt 222.0 lb

## 2018-06-09 DIAGNOSIS — F339 Major depressive disorder, recurrent, unspecified: Secondary | ICD-10-CM

## 2018-06-09 MED ORDER — FLUOXETINE HCL 20 MG PO TABS: 20.0000 mg | ORAL_TABLET | Freq: Every day | ORAL | 3 refills | Status: DC

## 2018-06-09 NOTE — Progress Notes (Signed)
Robyn Ramirez 42 y.o.   Chief Complaint  Patient presents with  . medication review    patient said medication is not working - [facility]    HISTORY OF PRESENT ILLNESS: This is a 41 y.o. female complaining of persistent depression despite medication.  Seen by his PA English on 10/21/2017 and started on Wellbutrin.  Wants to start different medication.  Also needs a referral to psychiatry.  HPI   Prior to Admission medications   Medication Sig Start Date End Date Taking? Authorizing Provider  buPROPion HCl (WELLBUTRIN PO) Take 1 tablet by mouth 2 (two) times daily.   Yes [provider]  methocarbamol (ROBAXIN) 500 MG tablet Take 1 tablet (500 mg total) by mouth 2 (two) times daily. 05/07/18  Yes Muthersbaugh, Dahlia Client, PA-C  valACYclovir (VALTREX) 1000 MG tablet Take 1 tablet (1,000 mg total) by mouth daily. For outbreak, half tablet every 12 hours for 3 days.  Then resume prescribed dosing. 11/10/17  Yes English, Judeth Cornfield D, PA  metroNIDAZOLE (FLAGYL) 500 MG tablet Take 1 tablet (500 mg total) by mouth 2 (two) times daily. Patient not taking: Reported on 06/09/2018 05/20/18   Verner Chol, CNM    No Known Allergies  Patient Active Problem List   Diagnosis Date Noted  . 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 Anxiety   . Depression   . Genital HSV   . Hemiplegic migraine   . Hypertension    history of, no medication, resolved with weight loss  . STD (sexually transmitted disease)    [facility]     Past Surgical History:  Procedure Laterality Date  . LAPAROSCOPIC TUBAL LIGATION Bilateral 09/27/2015   Procedure: LAPAROSCOPIC TUBAL LIGATION with Anna Genre CLIPS;  Surgeon: Tereso Newcomer, MD;  Location: [facility];  Service: Gynecology;  Laterality: Bilateral;  . MOUTH SURGERY    . TUBAL LIGATION      Social History   Socioeconomic  History  . Marital status: Single    Spouse name: Not on file  . Number of children: Not on file  . Years of education: Not on file  . Highest education level: Not on file  Occupational History  . Not on file  Social Needs  . Financial resource strain: Not on file  . Food insecurity:    Worry: Not on file    Inability: Not on file  . Transportation needs:    Medical: Not on file    Non-medical: Not on file  Tobacco Use  . Smoking status: Former Smoker    Packs/day: 0.10    Years: 20.00    Pack years: 2.00    Types: Cigarettes  . Smokeless tobacco: Never Used  Substance and Sexual Activity  . Alcohol use: Not Currently  . Drug use: No  . Sexual activity: Yes    Partners: Male    Birth control/protection: None, Surgical    Comment: tubal ligation   Lifestyle  . Physical activity:    Days per week: Not on file    Minutes per session: Not on file  . Stress: Not on file  Relationships  . Social connections:    Talks on phone: Not on file    Gets together: Not on file    Attends religious service: Not on file    Active member of club or organization: Not on file    Attends meetings  of clubs or organizations: Not on file    Relationship status: Not on file  . Intimate partner violence:    Fear of current or ex partner: Not on file    Emotionally abused: Not on file    Physically abused: Not on file    Forced sexual activity: Not on file  Other Topics Concern  . Not on file  Social History Narrative  . Not on file    Family History  Problem Relation Age of Onset  . Hypertension Mother   . Cancer Mother 38       leukemia  . Cancer Father        hepatic  . Hypertension Maternal Grandmother   . Cancer Paternal Grandmother      Review of Systems  Constitutional: Negative.  Negative for chills and fever.  HENT: Negative.   Eyes: Negative.   Respiratory: Negative.  Negative for cough and shortness of breath.   Cardiovascular: Negative.  Negative for chest pain  and palpitations.  Gastrointestinal: Negative.  Negative for abdominal pain, diarrhea, nausea and vomiting.  Genitourinary: Negative.   Musculoskeletal: Negative.  Negative for back pain, myalgias and neck pain.  Skin: Negative.  Negative for rash.  Neurological: Negative.  Negative for dizziness and headaches.  Endo/Heme/Allergies: Negative.   Psychiatric/Behavioral: Positive for depression. Negative for suicidal ideas.  All other systems reviewed and are negative.   Vitals:   06/09/18 1624  BP: 124/80  Pulse: 82  Resp: 17  Temp: 98.4 F (36.9 C)  SpO2: 98%    Physical Exam  Constitutional: She is oriented to person, place, and time. She appears well-developed and well-nourished.  HENT:  Head: Normocephalic and atraumatic.  Nose: Nose normal.  Mouth/Throat: Oropharynx is clear and moist.  Eyes: Pupils are equal, round, and reactive to light. Conjunctivae and EOM are normal.  Neck: Normal range of motion. Neck supple.  Cardiovascular: Normal rate and regular rhythm.  Pulmonary/Chest: Effort normal and breath sounds normal.  Musculoskeletal: Normal range of motion.  Neurological: She is alert and oriented to person, place, and time.  Skin: Skin is warm and dry. Capillary refill takes less than 2 seconds.  Psychiatric: She has a normal mood and affect. Her behavior is normal.  Vitals reviewed.  A total of 25 minutes was spent in the room with the patient, greater than 50% of which was in counseling/coordination of care regarding depression, management, medications, and need for follow-up with psychiatry.   ASSESSMENT & PLAN:  Robyn Ramirez was seen today for medication review.  Diagnoses and all orders for this visit:  Episode of recurrent major depressive disorder, unspecified depression episode severity (HCC) -     Ambulatory referral to Psychiatry -     FLUoxetine (PROZAC) 20 MG tablet; Take 1 tablet (20 mg total) by mouth daily.    Patient Instructions       If  you have lab work done today you will be contacted with your lab results within the next 2 weeks.  If you have not heard from [place] then please contact us. The fastest way to get your results is to register for My Chart.   IF you received an x-ray today, you will receive an invoice from [facility]. Please contact [facility] at [phone] with questions or concerns regarding your invoice.   IF you received labwork today, you will receive an invoice from [facility]. Please contact [facility] at [phone] with questions or concerns regarding your invoice.   Our billing staff  will not be able to assist you with questions regarding bills from these companies.  You will be contacted with the lab results as soon as they are available. The fastest way to get your results is to activate your My Chart account. Instructions are located on the last page of this paperwork. If you have not heard from [place] regarding the results in 2 weeks, please contact this office.      Major Depressive Disorder, Adult Major depressive disorder (MDD) is a mental health condition. MDD often makes you feel sad, hopeless, or helpless. MDD can also cause symptoms in your body. MDD can affect your:  Work.  School.  Relationships.  Other normal activities.  MDD can range from mild to very bad. It may occur once (single episode MDD). It can also occur many times (recurrent MDD). The main symptoms of MDD often include:  Feeling sad, depressed, or irritable most of the time.  Loss of interest.  MDD symptoms also include:  Sleeping too much or too little.  Eating too much or too little.  A change in your weight.  Feeling tired (fatigue) or having low energy.  Feeling worthless.  Feeling guilty.  Trouble making decisions.  Trouble thinking clearly.  Thoughts of suicide or harming others.  Feeling weak.  Feeling agitated.  Keeping yourself from being around other people  (isolation).  Follow these instructions at home: Activity  Do these things as told by your doctor: ? Go back to your normal activities. ? Exercise regularly. ? Spend time outdoors. Alcohol  Talk with your doctor about how alcohol can affect your antidepressant medicines.  Do not drink alcohol. Or, limit how much alcohol you drink. ? This means no more than 1 drink a day for nonpregnant women and 2 drinks a day for men. One drink equals one of these:  12 oz of beer.  5 oz of wine.  1 oz of hard liquor. General instructions  Take over-the-counter and prescription medicines only as told by your doctor.  Eat a healthy diet.  Get plenty of sleep.  Find activities that you enjoy. Make time to do them.  Think about joining a support group. Your doctor may be able to suggest a group for you.  Keep all follow-up visits as told by your doctor. This is important. Where to find more information:  The [employer] on Mental Illness: ? www.nami.org  [place] [employer] of Mental Health: ? http://www.maynard.net/  National Suicide Prevention Lifeline: ? 914 260 1785. This is free, 24-hour help. Contact a doctor if:  Your symptoms get worse.  You have new symptoms. Get help right away if:  You self-harm.  You see, hear, taste, smell, or feel things that are not present (hallucinate). If you ever feel like you may hurt yourself or others, or have thoughts about taking your own life, get help right away. You can go to your nearest emergency department or call:  Your local emergency services (911 in the [place]  A suicide crisis helpline, such as the National Suicide Prevention Lifeline: ? 380-729-7142. This is open 24 hours a day.  This information is not intended to replace advice given to you by your health care provider. Make sure you discuss any questions you have with your health care provider. Document Released: 07/30/2015 Document Revised: 05/04/2016 Document  Reviewed: 05/04/2016 Elsevier Interactive Patient Education  2017 Elsevier Inc.      Edwina Barth, MD [facility]

## 2018-06-09 NOTE — Patient Instructions (Addendum)
   If you have lab work done today you will be contacted with your lab results within the next 2 weeks.  If you have not heard from us then please contact us. The fastest way to get your results is to register for My Chart.   IF you received an x-ray today, you will receive an invoice from [facility]. Please contact [facility] at [phone] with questions or concerns regarding your invoice.   IF you received labwork today, you will receive an invoice from [facility]. Please contact [facility] at [phone] with questions or concerns regarding your invoice.   Our billing staff will not be able to assist you with questions regarding bills from these companies.  You will be contacted with the lab results as soon as they are available. The fastest way to get your results is to activate your My Chart account. Instructions are located on the last page of this paperwork. If you have not heard from us regarding the results in 2 weeks, please contact this office.     Major Depressive Disorder, Adult Major depressive disorder (MDD) is a mental health condition. MDD often makes you feel sad, hopeless, or helpless. MDD can also cause symptoms in your body. MDD can affect your:  Work.  School.  Relationships.  Other normal activities.  MDD can range from mild to very bad. It may occur once (single episode MDD). It can also occur many times (recurrent MDD). The main symptoms of MDD often include:  Feeling sad, depressed, or irritable most of the time.  Loss of interest.  MDD symptoms also include:  Sleeping too much or too little.  Eating too much or too little.  A change in your weight.  Feeling tired (fatigue) or having low energy.  Feeling worthless.  Feeling guilty.  Trouble making decisions.  Trouble thinking clearly.  Thoughts of suicide or harming others.  Feeling weak.  Feeling agitated.  Keeping yourself from being around other people  (isolation).  Follow these instructions at home: Activity  Do these things as told by your doctor: ? Go back to your normal activities. ? Exercise regularly. ? Spend time outdoors. Alcohol  Talk with your doctor about how alcohol can affect your antidepressant medicines.  Do not drink alcohol. Or, limit how much alcohol you drink. ? This means no more than 1 drink a day for nonpregnant women and 2 drinks a day for men. One drink equals one of these:  12 oz of beer.  5 oz of wine.  1 oz of hard liquor. General instructions  Take over-the-counter and prescription medicines only as told by your doctor.  Eat a healthy diet.  Get plenty of sleep.  Find activities that you enjoy. Make time to do them.  Think about joining a support group. Your doctor may be able to suggest a group for you.  Keep all follow-up visits as told by your doctor. This is important. Where to find more information:  National Alliance on Mental Illness: ? www.nami.org  [place] National Institute of Mental Health: ? www.nimh.nih.gov  National Suicide Prevention Lifeline: ? 1-800-273-8255. This is free, 24-hour help. Contact a doctor if:  Your symptoms get worse.  You have new symptoms. Get help right away if:  You self-harm.  You see, hear, taste, smell, or feel things that are not present (hallucinate). If you ever feel like you may hurt yourself or others, or have thoughts about taking your own life, get help right away. You can   go to your nearest emergency department or call:  Your local emergency services (911 in the [place]  A suicide crisis helpline, such as the National Suicide Prevention Lifeline: ? 1-800-273-8255. This is open 24 hours a day.  This information is not intended to replace advice given to you by your health care provider. Make sure you discuss any questions you have with your health care provider. Document Released: 07/30/2015 Document Revised: 05/04/2016 Document  Reviewed: 05/04/2016 Elsevier Interactive Patient Education  2017 Elsevier Inc.  

## 2018-07-27 ENCOUNTER — Ambulatory Visit (INDEPENDENT_AMBULATORY_CARE_PROVIDER_SITE_OTHER): Payer: BLUE CROSS/BLUE SHIELD | Admitting: Certified Nurse Midwife

## 2018-07-27 VITALS — BP 128/78 | Wt 223.0 lb

## 2018-07-27 DIAGNOSIS — Z23 Encounter for immunization: Secondary | ICD-10-CM

## 2018-07-27 NOTE — Progress Notes (Signed)
After obtaining consent, and per orders of Leota Sauerseborah Leonard CNM, injection of Gardasil 9 given by French Anaracy L Shardee Dieu in Left  Deltoid.  Third injection. Tolerated well.  Reported pain at previous injection sites, but they did improve with time.  Hx of Bilateral Tubal Ligation.  HPV Vaccine information sheet given to patient.  Ambulatory on discharge, denies complaints. To follow up as scheduled with Leota Sauerseborah Leonard CNM.      .Marland Kitchen

## 2019-08-27 IMAGING — CT CT HEAD W/O CM
4 series · 15 of 47 positions shown, 17 images · non-contrast
Comparison: None.

CLINICAL DATA: Right-sided headache radiating down the right neck.
Numbness and tingling to the right face and right arm.

EXAM:
CT HEAD WITHOUT CONTRAST
TECHNIQUE: Contiguous axial images were obtained from the base of the skull
through the vertex without intravenous contrast.

[Series 3: head without · axial · non-contrast · 0.47mm/px · z∈[-94,+26]mm · 7 of 34 slices shown, 9 images]
[im 5/34  brain]
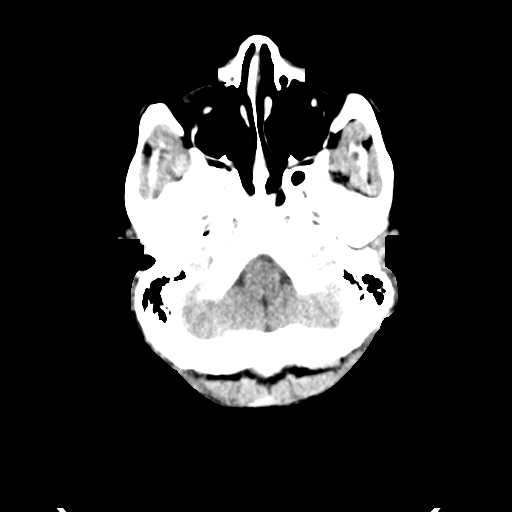
[im 5/34  bone]
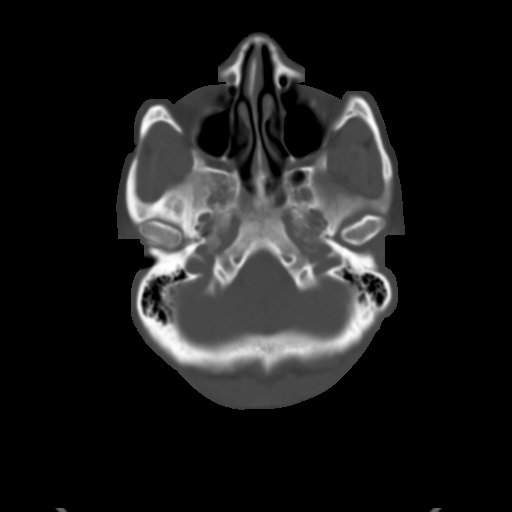
[im 9/34  brain]
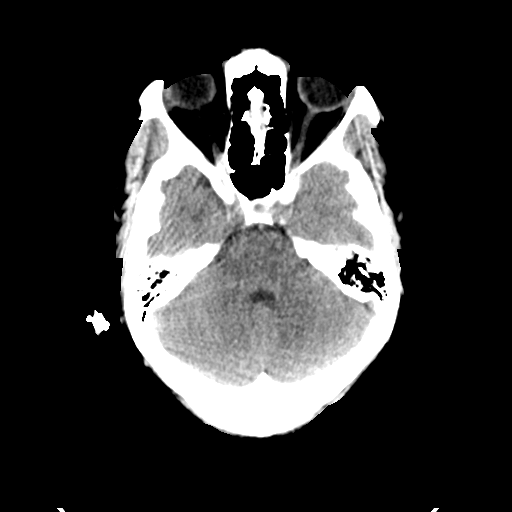
[im 13/34  brain]
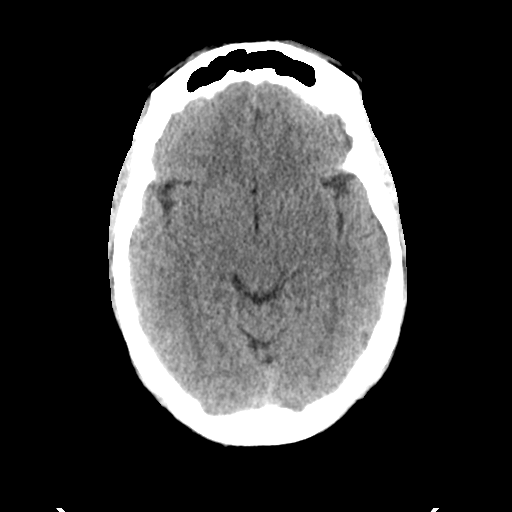
[im 17/34  brain]
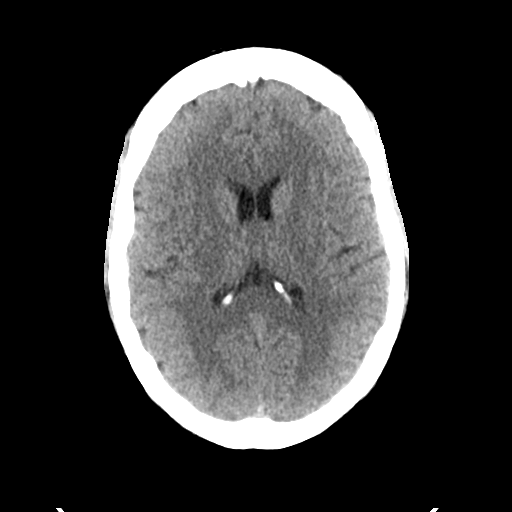
[im 21/34  brain]
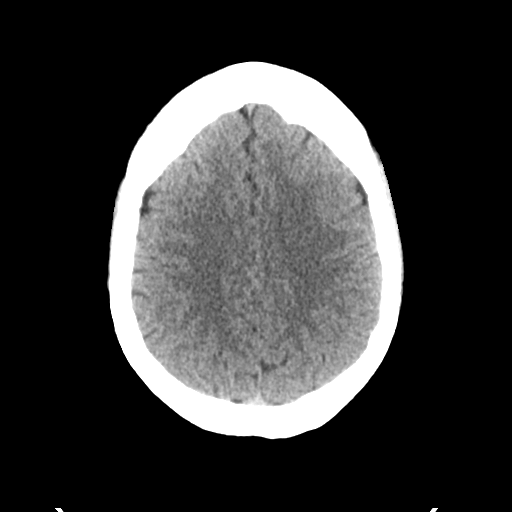
[im 21/34  bone]
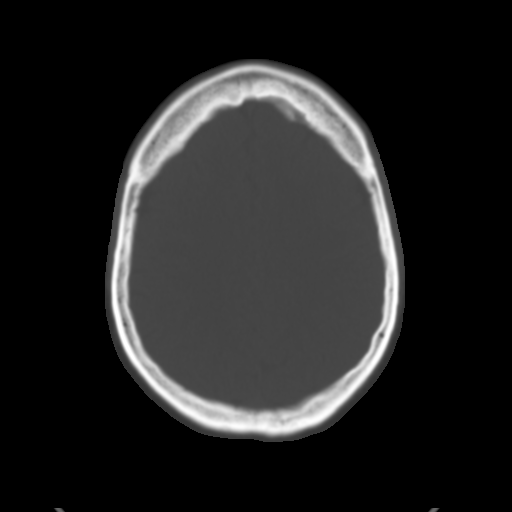
[im 25/34  brain]
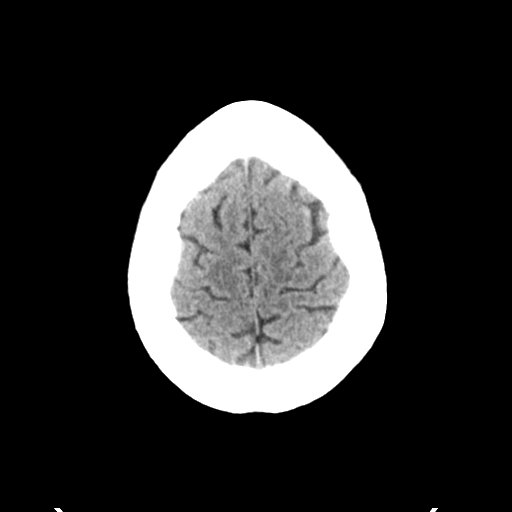
[im 29/34  brain]
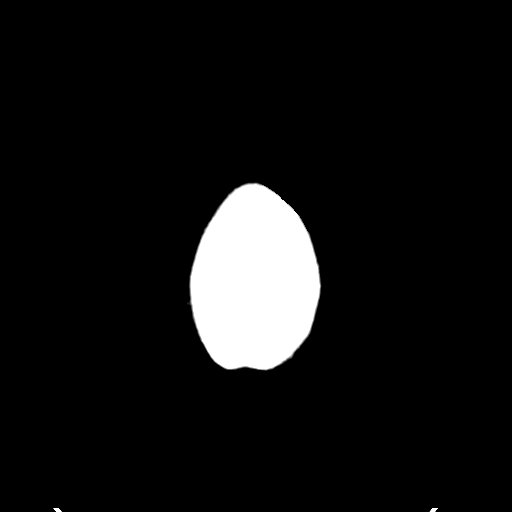

[Series 4: head bone · axial · 0.47mm/px · z∈[-98,-82]mm · 2 of 85 slices shown]
[im 9/85  bone]
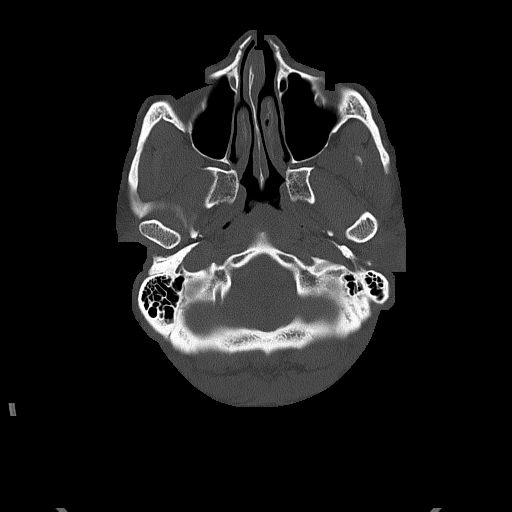
[im 17/85  bone]
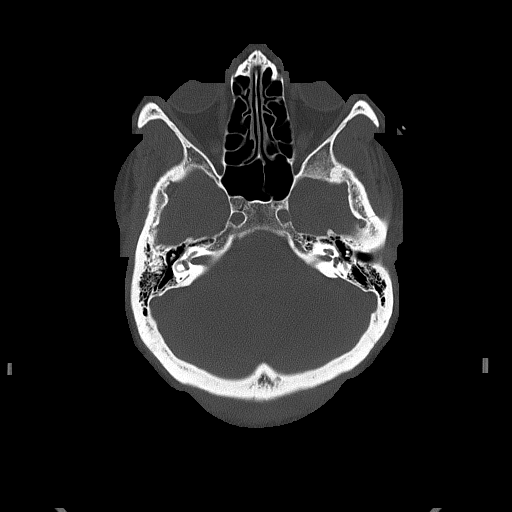

[Series 5: head without cor · coronal · non-contrast · 0.33mm/px · 3 of 72 slices shown]
[im 24/72  brain]
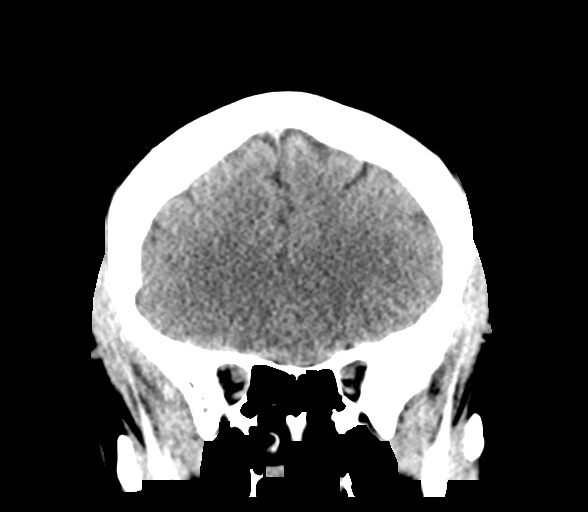
[im 32/72  brain]
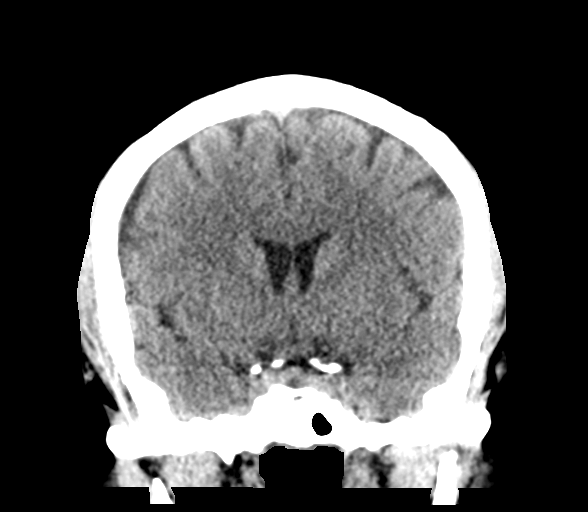
[im 40/72  brain]
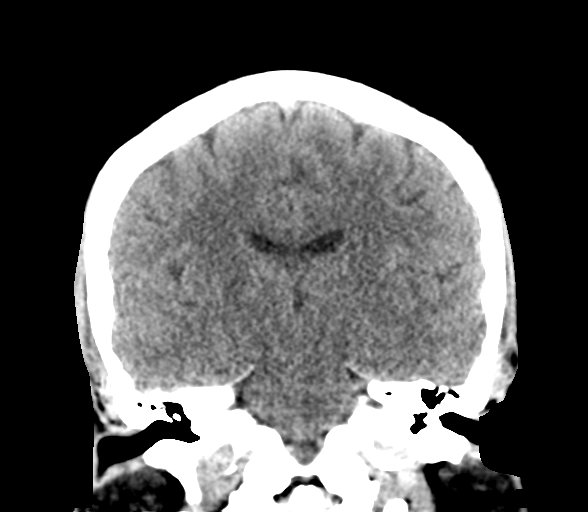

[Series 6: head without sag · sagittal · non-contrast · 0.33mm/px · 3 of 67 slices shown]
[im 23/67  brain]
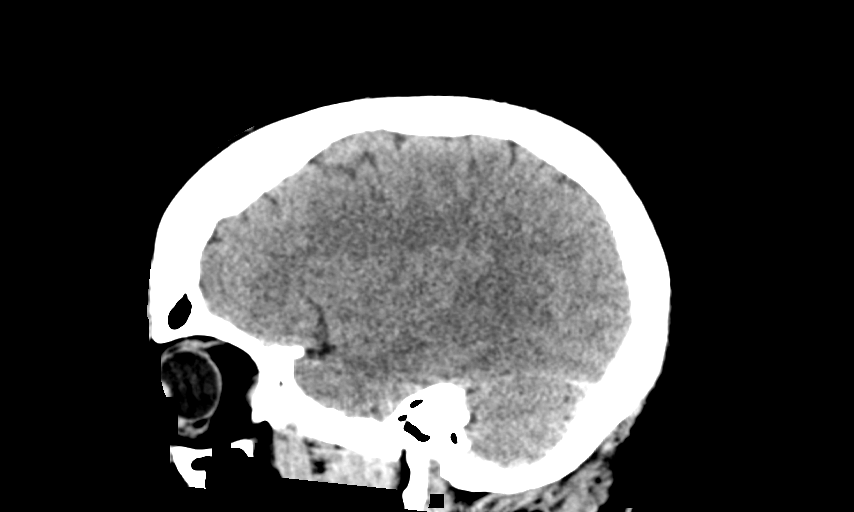
[im 34/67  brain]
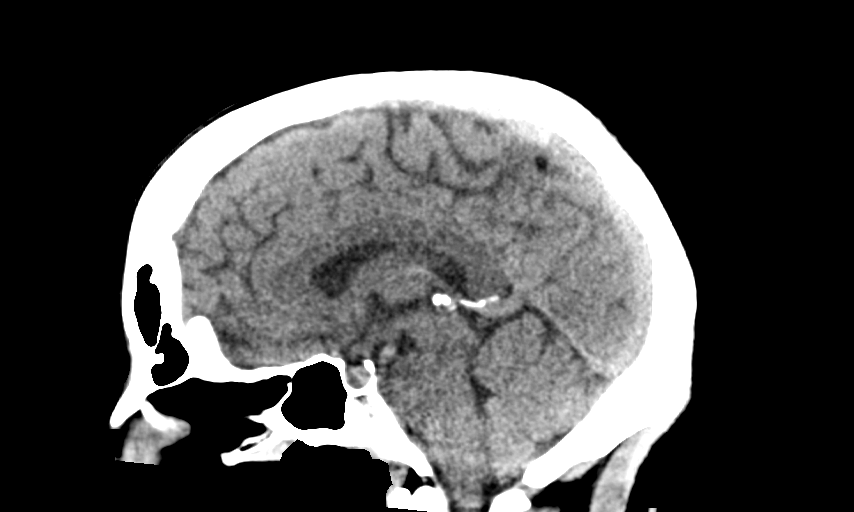
[im 45/67  brain]
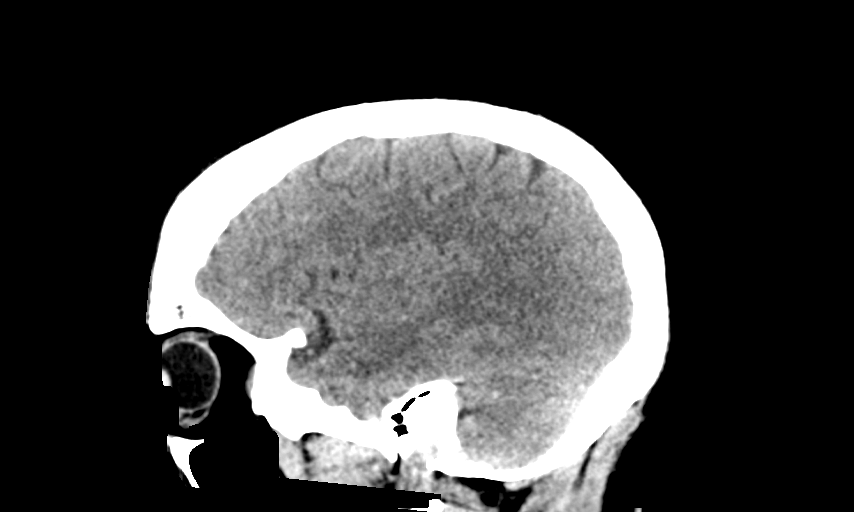

[15 of 47 positions shown; findings below may reference images not displayed]

FINDINGS: Brain: No evidence of acute infarction, hemorrhage, hydrocephalus,
extra-axial collection or mass lesion/mass effect.

Vascular: No hyperdense vessel or unexpected calcification.

Skull: Normal. Negative for fracture or focal lesion.

Sinuses/Orbits: Probable retention cyst in the left maxillary
antrum. Paranasal sinuses and mastoid air cells are otherwise clear.
No acute air-fluid levels.

Other: None.
IMPRESSION: No acute intracranial abnormality.

## 2019-09-26 NOTE — Progress Notes (Signed)
44 y.o. [id number] Single Black or African American Not Hispanic or Latino female here for annual exam.   Cycles q month x 3-5 days, saturates a pad in 8+ hours. Rare menstrual cramps.   Patient has been suffering from constipation. The constipation has been present for over 20 years. She needs a stool softener or laxative to have a BM. If she doesn't take anything but a metamucil or stool softener she would have a BM every 4 days. She is typically taking a laxative to have a BM. That is not change. She She also has a random pain in her lower right abdomen since October. The pain feels like a sharp menstrual cramp, only on the right. When it comes it lasts for 5 minutes then gets more dull. It  will come and go over 24 hours. It comes 1-2 x a month, not cyclic. The pain is up to a 8/10 in severity. It is tender in the RLQ when she has the pain. No urinary c/o. She is sexually active active, engaged. No dyspareunia.     Patient's last menstrual period was 09/08/2019 (exact date).          Sexually active: Yes.    The current method of family planning is tubal ligation.    Exercising: Yes.    Weight lifting, dnace, Cardio Smoker:  no  Health Maintenance: Pap 01/11/18 Normal, negative HPV :  12-08-16 ASC-H, NEG HR HPV. 01/16/17 colposcopy with CIN I History of abnormal Pap:  Yes Squamous cells 11/2016 MMG:  None  BMD:   None  Colonoscopy: none TDaP:  Had it for work in 10/19 Gardasil:  Completed   reports that she has quit smoking. Her smoking use included cigarettes. She has a 2.00 pack-year smoking history. She has never used smokeless tobacco. She reports previous alcohol use. She reports that she does not use drugs. Works for a [employer]. 3 kids, one grown. 17 and 50 years old are at home.   Past Medical History:  Diagnosis Date  . Anxiety   . Depression   . Genital HSV   . Hemiplegic migraine   . Hypertension    history of, no medication, resolved with weight loss  . STD  (sexually transmitted disease)    [facility]     Past Surgical History:  Procedure Laterality Date  . LAPAROSCOPIC TUBAL LIGATION Bilateral 09/27/2015   Procedure: LAPAROSCOPIC TUBAL LIGATION with Anna Genre CLIPS;  Surgeon: Tereso Newcomer, MD;  Location: [facility];  Service: Gynecology;  Laterality: Bilateral;  . MOUTH SURGERY    . TUBAL LIGATION      Current Outpatient Medications  Medication Sig Dispense Refill  . valACYclovir (VALTREX) 1000 MG tablet Take 1 tablet (1,000 mg total) by mouth daily. For outbreak, half tablet every 12 hours for 3 days.  Then resume prescribed dosing. 90 tablet 3  . methocarbamol (ROBAXIN) 500 MG tablet Take 1 tablet (500 mg total) by mouth 2 (two) times daily. 20 tablet 0   No current facility-administered medications for this visit.    Family History  Problem Relation Age of Onset  . Hypertension Mother   . Cancer Mother 51       leukemia  . Cancer Father        hepatic  . Hypertension Maternal Grandmother   . Cancer Paternal Grandmother     Review of Systems  Gastrointestinal: Positive for constipation.  All other systems reviewed and are negative.   Exam:   BP  128/64   Temp (!) 97.2 F (36.2 C)   Ht 5\' 6"  (1.676 m)   Wt 190 lb (86.2 kg)   LMP 09/08/2019 (Exact Date)   BMI 30.67 kg/m   Weight change: @WEIGHTCHANGE @ Height:   Height: 5\' 6"  (167.6 cm)  Ht Readings from Last 3 Encounters:  09/28/19 5\' 6"  (1.676 m)  06/09/18 5\' 6"  (1.676 m)  05/18/18 5\' 6"  (1.676 m)    General appearance: alert, cooperative and appears stated age Head: Normocephalic, without obvious abnormality, atraumatic Neck: no adenopathy, supple, symmetrical, trachea midline and thyroid normal to inspection and palpation Lungs: clear to auscultation bilaterally Cardiovascular: regular rate and rhythm Breasts: normal appearance, no masses or tenderness Abdomen: soft, non-tender; non distended,  no masses,  no organomegaly. 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 No rashes or lesions Lymph nodes: Cervical, supraclavicular, and axillary nodes normal. No abnormal inguinal nodes palpated Neurologic: Grossly normal   Pelvic: External genitalia:  no lesions              Urethra:  normal appearing urethra with no masses, tenderness or lesions              Bartholins and Skenes: normal                 Vagina: normal appearing vagina with normal color and discharge, no lesions              Cervix: no cervical motion tenderness and no lesions               Bimanual Exam:  Uterus:  normal size, contour, position, consistency, mobility, non-tender and anteverted              Adnexa: no mass, fullness, tenderness               Rectovaginal: Confirms               Anus:  normal sphincter tone, no lesions  Gae Dry chaperoned for the exam.  A:  Well Woman with normal exam  Constipation, we discussed that chronic laxative use can cause her colon to be lazy. Okay to use metamucil, magnesium 500 mg a day, stool softeners, or miralax  RLQ abdominal pain, normal abdominal and pelvic exam. Discussed suspicion of GI cause of her pain specifically since it is not cyclic, normal exam and she has long term issues with constipation.  H/O recurrent BV (she is using boric acid after her cycle). No symptoms.  H/O HSV, refill for prn valtrex sent  P:   Pap with hpv  Referral to Dr Carlean Purl for chronic long term constipation  We discussed the option of a pelvic ultrasound, given her negative pelvic exam and constipation I recommend she see Dr Carlean Purl first. If he doesn't feel her pain is GI we can set her up for an ultrasound.    Screening labs  Discussed breast self exam  Discussed calcium and vit D intake

## 2019-09-27 ENCOUNTER — Other Ambulatory Visit: Payer: Self-pay

## 2019-09-28 ENCOUNTER — Encounter: Payer: Self-pay | Admitting: Obstetrics and Gynecology

## 2019-09-28 ENCOUNTER — Ambulatory Visit: Payer: Managed Care, Other (non HMO) | Admitting: Obstetrics and Gynecology

## 2019-09-28 ENCOUNTER — Other Ambulatory Visit (HOSPITAL_COMMUNITY)
Admission: RE | Admit: 2019-09-28 | Discharge: 2019-09-28 | Disposition: A | Discharge status: Home / Self Care | Payer: Managed Care, Other (non HMO) | Source: Ambulatory Visit | Attending: Obstetrics and Gynecology | Admitting: Obstetrics and Gynecology

## 2019-09-28 VITALS — BP 128/64 | Temp 97.2°F | Ht 66.0 in | Wt 190.0 lb

## 2019-09-28 DIAGNOSIS — Z0000 Encounter for general adult medical examination without abnormal findings: Secondary | ICD-10-CM

## 2019-09-28 DIAGNOSIS — R1031 Right lower quadrant pain: Secondary | ICD-10-CM

## 2019-09-28 DIAGNOSIS — Z01419 Encounter for gynecological examination (general) (routine) without abnormal findings: Secondary | ICD-10-CM | POA: Diagnosis not present

## 2019-09-28 DIAGNOSIS — Z124 Encounter for screening for malignant neoplasm of cervix: Secondary | ICD-10-CM | POA: Insufficient documentation

## 2019-09-28 DIAGNOSIS — K5900 Constipation, unspecified: Secondary | ICD-10-CM

## 2019-09-28 MED ORDER — VALACYCLOVIR HCL 500 MG PO TABS: ORAL_TABLET | ORAL | 1 refills | Status: DC

## 2019-09-28 NOTE — Patient Instructions (Signed)

## 2019-09-29 ENCOUNTER — Encounter: Payer: Self-pay | Admitting: Internal Medicine

## 2019-09-29 LAB — COMPREHENSIVE METABOLIC PANEL
ALT: 19 IU/L (ref 0–32)
AST: 22 IU/L (ref 0–40)
Albumin/Globulin Ratio: 1.6 (ref 1.2–2.2)
Albumin: 4.5 g/dL (ref 3.8–4.8)
Alkaline Phosphatase: 62 IU/L (ref 39–117)
BUN/Creatinine Ratio: 15 (ref 9–23)
BUN: 14 mg/dL (ref 6–24)
Bilirubin Total: 0.4 mg/dL (ref 0.0–1.2)
CO2: 26 mmol/L (ref 20–29)
Calcium: 10.1 mg/dL (ref 8.7–10.2)
Chloride: 102 mmol/L (ref 96–106)
Creatinine, Ser: 0.96 mg/dL (ref 0.57–1.00)
GFR calc Af Amer: 85 mL/min/{1.73_m2} (ref >59)
GFR calc non Af Amer: 74 mL/min/{1.73_m2} (ref >59)
Globulin, Total: 2.9 g/dL (ref 1.5–4.5)
Glucose: 81 mg/dL (ref 65–99)
Potassium: 4.4 mmol/L (ref 3.5–5.2)
Sodium: 140 mmol/L (ref 134–144)
Total Protein: 7.4 g/dL (ref 6.0–8.5)

## 2019-09-29 LAB — CBC
Hematocrit: 38.8 % (ref 34.0–46.6)
Hemoglobin: 13.1 g/dL (ref 11.1–15.9)
MCH: 31.7 pg (ref 26.6–33.0)
MCHC: 33.8 g/dL (ref 31.5–35.7)
MCV: 94 fL (ref 79–97)
Platelets: 272 10*3/uL (ref 150–450)
RBC: 4.13 x10E6/uL (ref 3.77–5.28)
RDW: 12.4 % (ref 11.7–15.4)
WBC: 6.9 10*3/uL (ref 3.4–10.8)

## 2019-09-29 LAB — LIPID PANEL
Chol/HDL Ratio: 2.6 ratio (ref 0.0–4.4)
Cholesterol, Total: 159 mg/dL (ref 100–199)
HDL: 62 mg/dL (ref >39)
LDL Chol Calc (NIH): 83 mg/dL (ref 0–99)
Triglycerides: 74 mg/dL (ref 0–149)
VLDL Cholesterol Cal: 14 mg/dL (ref 5–40)

## 2019-09-30 LAB — CYTOLOGY - PAP
Comment: NEGATIVE
Diagnosis: UNDETERMINED — AB
High risk HPV: NEGATIVE

## 2019-11-11 ENCOUNTER — Encounter: Payer: Self-pay | Admitting: Internal Medicine

## 2019-11-11 ENCOUNTER — Ambulatory Visit: Payer: Managed Care, Other (non HMO) | Admitting: Internal Medicine

## 2019-11-11 ENCOUNTER — Other Ambulatory Visit: Payer: Self-pay

## 2019-11-11 VITALS — BP 100/70 | HR 68 | Temp 97.9°F | Ht 66.5 in | Wt 192.4 lb

## 2019-11-11 DIAGNOSIS — R140 Abdominal distension (gaseous): Secondary | ICD-10-CM | POA: Diagnosis not present

## 2019-11-11 DIAGNOSIS — K5909 Other constipation: Secondary | ICD-10-CM

## 2019-11-11 DIAGNOSIS — R1031 Right lower quadrant pain: Secondary | ICD-10-CM | POA: Diagnosis not present

## 2019-11-11 NOTE — Patient Instructions (Signed)
You have been given a testing kit to check for small intestine bacterial overgrowth (SIBO) which is completed by a company named [facility]. Make sure to return your test in the mail using the return mailing label given you along with the kit. Your demographic and insurance information have already been sent to the company and they should be in contact with you over the next week regarding this test. Make sure to discuss with [facility] PRIOR to having the test if they have gotten informatoin from your insurance company as to how much your testing will cost out of pocket, if any. Please keep in mind that you will be getting a call from phone number [phone] or a similar number. If you do not hear from them within this time frame, please call our office at [phone].     I appreciate the opportunity to care for you. Silvano Rusk, MD, [facility]

## 2019-11-11 NOTE — Progress Notes (Signed)
Robyn Ramirez 43 y.o. 1975-12-13 [id number] Referred by: Robyn Dom, MD  Assessment & Plan:   Encounter Diagnoses  Name Primary?  . RLQ abdominal pain Yes  . Chronic constipation   . Bloating     Abdominal or groin pain is gone.  Adhesions, question something gynecological.  Observe for recurrence.  Has not happened since [place] so no need to image or test further.  Chronic constipation could be from small intestinal bacterial overgrowth so we will test for that.  If positive test then treat.  If not different bowel regimen I think MiraLAX purge and an attempted daily MiraLAX with intermittent stimulant laxative versus other pharmacologic treatment depending upon insurance prescription coverage.  Anorectal function appears intact on physical exam. SIBO test  I appreciate the opportunity to care for this patient.  I will send a copy to Robyn Boast MD Subjective:   Chief Complaint:  HPI 44 year old African-American woman referred by Dr. Talbert Ramirez for constipation and right lower quadrant pain.  Review of gynecology visit as below  GYN Dr. Talbert Ramirez 09/28/2019 c/o 20 yrs + constipation, using stool softener or metamucil. W/o these BM q 4 d RLQ pain off/on since 06/2019, sharp cramp, then dull can be 8/10 No urinary or dyspareunia c/o Pelvic exam ok  CBC and CMET NL  The patient reports longstanding infrequent defecation with lack of urge to defecate and then difficulty with defecation.  She has tried fiber supplementation but it seems to bloat her MiraLAX has been used but probably not on a regular daily basis but that may bloat her some to.  She believes she eats healthy with a lot of fresh vegetables and not a lot of packaged foods.  Sometimes she will get to the point where she has not moved her bowels in several days and will even get nausea and vomiting.  She does not want to be dependent on laxatives if she can help.  She does not really take stimulant  laxatives that I know.  In the late fall till about New Year's she had a couple of episodes each month of fairly intense right lower quadrant pain and has not had that since.  Dr. Talbert Ramirez did not think she had a gynecologic problem.  She has not had those symptoms since New Year's Day 2021.  No prior colonoscopy no need for it no family history of colon cancer that is relevant. No Known Allergies Current Meds  Medication Sig  . valACYclovir (VALTREX) 500 MG tablet Take one tablet bid x 3 days as needed   Past Medical History:  Diagnosis Date  . Anxiety   . Depression   . Genital HSV   . Hemiplegic migraine   . Hypertension    history of, no medication, resolved with weight loss  . Trichomonas vaginalis (TV) infection    [facility]    Past Surgical History:  Procedure Laterality Date  . LAPAROSCOPIC TUBAL LIGATION Bilateral 09/27/2015   Procedure: LAPAROSCOPIC TUBAL LIGATION with Robyn Ramirez CLIPS;  Surgeon: Robyn Oman, MD;  Location: [facility];  Service: Gynecology;  Laterality: Bilateral;  . MOUTH SURGERY     Social History   Social History Narrative   Single, 3 daughters 2 here in [place] 1 in [place] for city block health   Former smoker 1 caffeinated beverage a day no tobacco or alcohol or drug use   family history includes Cancer in her paternal grandmother; Hypertension in her maternal grandmother and mother;  Leukemia (age of onset: 82) in her mother; Liver cancer in her father.   Review of Systems As per HPI all other review of systems negative  Objective:   Physical Exam BP 100/70 (BP Location: Left Arm, Patient Position: Sitting, Cuff Size: Normal)   Pulse 68   Temp 97.9 F (36.6 C)   Ht 5' 6.5" (1.689 m) Comment: height measured without shoes  Wt 192 lb 6 oz (87.3 kg)   LMP 10/30/2019   BMI 30.58 kg/m  NAD anicterc Lungs cta Cor NL abd soft NT BS + no HSM, mass Skin tattoos, small umbilical keloid Alert and oriented x 3 Nl  mood/affect  Robyn Ramirez, CMA present.   Anoderm inspection revealed no sig abnormality Anal wink was + Digital exam revealed normal resting tone and voluntary squeeze. No mass or rectocele present. Simulated defecation with valsalva revealed appropriate abdominal contraction and descent.

## 2019-11-18 ENCOUNTER — Encounter: Payer: Self-pay | Admitting: Certified Nurse Midwife

## 2020-03-19 ENCOUNTER — Ambulatory Visit: Payer: Managed Care, Other (non HMO) | Attending: Internal Medicine

## 2020-03-19 DIAGNOSIS — Z20822 Contact with and (suspected) exposure to covid-19: Secondary | ICD-10-CM

## 2020-03-20 LAB — NOVEL CORONAVIRUS, NAA: SARS-CoV-2, NAA: NOT DETECTED

## 2020-03-20 LAB — SARS-COV-2, NAA 2 DAY TAT

## 2020-09-06 ENCOUNTER — Other Ambulatory Visit: Payer: Self-pay

## 2020-09-06 ENCOUNTER — Ambulatory Visit: Payer: Managed Care, Other (non HMO) | Admitting: Emergency Medicine

## 2020-09-06 ENCOUNTER — Encounter: Payer: Self-pay | Admitting: Emergency Medicine

## 2020-09-06 VITALS — BP 122/74 | HR 80 | Temp 98.2°F | Resp 16 | Ht 66.0 in | Wt 195.8 lb

## 2020-09-06 DIAGNOSIS — R87611 Atypical squamous cells cannot exclude high grade squamous intraepithelial lesion on cytologic smear of cervix (ASC-H): Secondary | ICD-10-CM

## 2020-09-06 DIAGNOSIS — Z7689 Persons encountering health services in other specified circumstances: Secondary | ICD-10-CM | POA: Diagnosis not present

## 2020-09-06 MED ORDER — VALACYCLOVIR HCL 500 MG PO TABS: ORAL_TABLET | ORAL | 1 refills | Status: DC

## 2020-09-06 NOTE — Patient Instructions (Addendum)
   If you have lab work done today you will be contacted with your lab results within the next 2 weeks.  If you have not heard from us then please contact us. The fastest way to get your results is to register for My Chart.   IF you received an x-ray today, you will receive an invoice from [facility]. Please contact [facility] at [phone] with questions or concerns regarding your invoice.   IF you received labwork today, you will receive an invoice from [facility]. Please contact [facility] at [phone] with questions or concerns regarding your invoice.   Our billing staff will not be able to assist you with questions regarding bills from these companies.  You will be contacted with the lab results as soon as they are available. The fastest way to get your results is to activate your My Chart account. Instructions are located on the last page of this paperwork. If you have not heard from us regarding the results in 2 weeks, please contact this office.       Health Maintenance, Female Adopting a healthy lifestyle and getting preventive care are important in promoting health and wellness. Ask your health care provider about:  The right schedule for you to have regular tests and exams.  Things you can do on your own to prevent diseases and keep yourself healthy. What should I know about diet, weight, and exercise? Eat a healthy diet   Eat a diet that includes plenty of vegetables, fruits, low-fat dairy products, and lean protein.  Do not eat a lot of foods that are high in solid fats, added sugars, or sodium. Maintain a healthy weight Body mass index (BMI) is used to identify weight problems. It estimates body fat based on height and weight. Your health care provider can help determine your BMI and help you achieve or maintain a healthy weight. Get regular exercise Get regular exercise. This is one of the most important things you can do for your health. Most  adults should:  Exercise for at least 150 minutes each week. The exercise should increase your heart rate and make you sweat (moderate-intensity exercise).  Do strengthening exercises at least twice a week. This is in addition to the moderate-intensity exercise.  Spend less time sitting. Even light physical activity can be beneficial. Watch cholesterol and blood lipids Have your blood tested for lipids and cholesterol at 43 years of age, then have this test every 5 years. Have your cholesterol levels checked more often if:  Your lipid or cholesterol levels are high.  You are older than 43 years of age.  You are at high risk for heart disease. What should I know about cancer screening? Depending on your health history and family history, you may need to have cancer screening at various ages. This may include screening for:  Breast cancer.  Cervical cancer.  Colorectal cancer.  Skin cancer.  Lung cancer. What should I know about heart disease, diabetes, and high blood pressure? Blood pressure and heart disease  High blood pressure causes heart disease and increases the risk of stroke. This is more likely to develop in people who have high blood pressure readings, are of African descent, or are overweight.  Have your blood pressure checked: ? Every 3-5 years if you are 18-39 years of age. ? Every year if you are 40 years old or older. Diabetes Have regular diabetes screenings. This checks your fasting blood sugar level. Have the screening done:  Once   every three years after age 40 if you are at a normal weight and have a low risk for diabetes.  More often and at a younger age if you are overweight or have a high risk for diabetes. What should I know about preventing infection? Hepatitis B If you have a higher risk for hepatitis B, you should be screened for this virus. Talk with your health care provider to find out if you are at risk for hepatitis B infection. Hepatitis  C Testing is recommended for:  Everyone born from 1945 through 1965.  Anyone with known risk factors for hepatitis C. Sexually transmitted infections (STIs)  Get screened for STIs, including gonorrhea and chlamydia, if: ? You are sexually active and are younger than 43 years of age. ? You are older than 43 years of age and your health care provider tells you that you are at risk for this type of infection. ? Your sexual activity has changed since you were last screened, and you are at increased risk for chlamydia or gonorrhea. Ask your health care provider if you are at risk.  Ask your health care provider about whether you are at high risk for HIV. Your health care provider may recommend a prescription medicine to help prevent HIV infection. If you choose to take medicine to prevent HIV, you should first get tested for HIV. You should then be tested every 3 months for as long as you are taking the medicine. Pregnancy  If you are about to stop having your period (premenopausal) and you may become pregnant, seek counseling before you get pregnant.  Take 400 to 800 micrograms (mcg) of folic acid every day if you become pregnant.  Ask for birth control (contraception) if you want to prevent pregnancy. Osteoporosis and menopause Osteoporosis is a disease in which the bones lose minerals and strength with aging. This can result in bone fractures. If you are 65 years old or older, or if you are at risk for osteoporosis and fractures, ask your health care provider if you should:  Be screened for bone loss.  Take a calcium or vitamin D supplement to lower your risk of fractures.  Be given hormone replacement therapy (HRT) to treat symptoms of menopause. Follow these instructions at home: Lifestyle  Do not use any products that contain nicotine or tobacco, such as cigarettes, e-cigarettes, and chewing tobacco. If you need help quitting, ask your health care provider.  Do not use street  drugs.  Do not share needles.  Ask your health care provider for help if you need support or information about quitting drugs. Alcohol use  Do not drink alcohol if: ? Your health care provider tells you not to drink. ? You are pregnant, may be pregnant, or are planning to become pregnant.  If you drink alcohol: ? Limit how much you use to 0-1 drink a day. ? Limit intake if you are breastfeeding.  Be aware of how much alcohol is in your drink. In the [place] one drink equals one 12 oz bottle of beer (355 mL), one 5 oz glass of wine (148 mL), or one 1 oz glass of hard liquor (44 mL). General instructions  Schedule regular health, dental, and eye exams.  Stay current with your vaccines.  Tell your health care provider if: ? You often feel depressed. ? You have ever been abused or do not feel safe at home. Summary  Adopting a healthy lifestyle and getting preventive care are important in promoting health and   wellness.  Follow your health care provider's instructions about healthy diet, exercising, and getting tested or screened for diseases.  Follow your health care provider's instructions on monitoring your cholesterol and blood pressure. This information is not intended to replace advice given to you by your health care provider. Make sure you discuss any questions you have with your health care provider. Document Revised: 08/11/2018 Document Reviewed: 08/11/2018 Elsevier Patient Education  2020 Elsevier Inc.  

## 2020-09-06 NOTE — Progress Notes (Signed)
Robyn Ramirez 45 y.o.   Chief Complaint  Patient presents with  . Transitions Of Care    Former Dr Robyn Ramirez    HISTORY OF PRESENT ILLNESS: This is a 45 y.o. female here to establish care with me.  Used to see Dr. Nolon Ramirez. Doing well.  Has no complaints or medical concerns today. History of hypertension, anxiety and depression resolved with lifestyle changes including better nutrition and exercise. Fully vaccinated against Covid.  HPI   Prior to Admission medications   Medication Sig Start Date End Date Taking? Authorizing Provider  Ascorbic Acid (VITAMIN C) 100 MG tablet Take 100 mg by mouth daily.   Yes [provider]  Cholecalciferol (VITAMIN D3 PO) Take by mouth daily.   Yes [provider]  ECHINACEA PO Take by mouth daily.   Yes [provider]  Multiple Vitamin (MULTIVITAMIN) tablet Take 1 tablet by mouth daily.   Yes [provider]  Niacin (VITAMIN B-3 PO) Take by mouth daily.   Yes [provider]  nitrofurantoin, macrocrystal-monohydrate, (MACROBID) 100 MG capsule Take 100 mg by mouth 2 (two) times daily.   Yes [provider]  Zinc 100 MG TABS Take by mouth daily.   Yes [provider]  valACYclovir (VALTREX) 500 MG tablet Take one tablet bid x 3 days as needed 09/28/19   Salvadore Dom, MD    No Known Allergies  Patient Active Problem List   Diagnosis Date Noted  . 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 Anxiety   . Depression   . Genital HSV   . Hemiplegic migraine   . Hypertension    history of, no medication, resolved with weight loss  . Trichomonas vaginalis (TV) infection    [facility]     Past Surgical History:  Procedure Laterality Date  . LAPAROSCOPIC TUBAL LIGATION Bilateral 09/27/2015   Procedure: LAPAROSCOPIC TUBAL LIGATION with Sharmon Leyden  CLIPS;  Surgeon: Osborne Oman, MD;  Location: [facility];  Service: Gynecology;  Laterality: Bilateral;  . MOUTH SURGERY    . TUBAL LIGATION N/A    [employer] 09/03/2020    Social History   Socioeconomic History  . Marital status: Single    Spouse name: Not on file  . Number of children: 3  . Years of education: Not on file  . Highest education level: Not on file  Occupational History  . Occupation: customer service  Tobacco Use  . Smoking status: Former Smoker    Packs/day: 0.10    Years: 20.00    Pack years: 2.00    Types: Cigarettes    Quit date: 2018    Years since quitting: 4.0  . Smokeless tobacco: Never Used  Vaping Use  . Vaping Use: Never used  Substance and Sexual Activity  . Alcohol use: Yes    Comment: rarely  . Drug use: No  . Sexual activity: Yes    Partners: Male    Birth control/protection: None, Surgical    Comment: tubal ligation   Other Topics Concern  . Not on file  Social History Narrative   Single, 3 daughters 2 here in [place] 1 in [place] for city block health   Former smoker 1 caffeinated beverage a day no tobacco or alcohol or drug use   Social Determinants of Radio broadcast assistant Strain: Not on file  Food Insecurity: Not on file  Transportation Needs: Not on file  Physical Activity: Not on file  Stress: Not on file  Social Connections: Not on file  Intimate Partner Violence: Not on file    Family History  Problem Relation Age of Onset  . Hypertension Mother   . Leukemia Mother 7  . Liver cancer Father   . Hypertension Maternal Grandmother   . Cancer Paternal Grandmother        type unknown     Review of Systems  Constitutional: Negative.  Negative for chills and fever.  HENT: Negative.  Negative for congestion and sore throat.   Respiratory: Negative.  Negative for cough and shortness of breath.   Cardiovascular: Negative.  Negative for chest pain and palpitations.  Gastrointestinal:  Negative.  Negative for abdominal pain, diarrhea, nausea and vomiting.  Genitourinary: Negative.  Negative for hematuria.  Skin: Negative.  Negative for rash.  Neurological: Negative.  Negative for dizziness and headaches.  All other systems reviewed and are negative.  Today's Vitals   09/06/20 1453  BP: 122/74  Pulse: 80  Resp: 16  Temp: 98.2 F (36.8 C)  TempSrc: Temporal  SpO2: 97%  Weight: 195 lb 12.8 oz (88.8 kg)  Height: 5\' 6"  (1.676 m)   Body mass index is 31.6 kg/m.   Physical Exam Vitals reviewed.  Constitutional:      Appearance: Normal appearance.  HENT:     Head: Normocephalic.  Eyes:     Extraocular Movements: Extraocular movements intact.     Pupils: Pupils are equal, round, and reactive to light.  Cardiovascular:     Rate and Rhythm: Normal rate.  Pulmonary:     Effort: Pulmonary effort is normal.  Musculoskeletal:     Cervical back: Normal range of motion.  Skin:    General: Skin is warm and dry.  Neurological:     General: No focal deficit present.     Mental Status: She is alert and oriented to person, place, and time.  Psychiatric:        Mood and Affect: Mood normal.        Behavior: Behavior normal.      ASSESSMENT & PLAN: Robyn Ramirez was seen today for transitions of care.  Diagnoses and all orders for this visit:  Encounter to establish care    Patient Instructions       If you have lab work done today you will be contacted with your lab results within the next 2 weeks.  If you have not heard from Veatrice Kells then please contact us. The fastest way to get your results is to register for My Chart.   IF you received an x-ray today, you will receive an invoice from [facility]. Please contact [facility] at [phone] with questions or concerns regarding your invoice.   IF you received labwork today, you will receive an invoice from [facility]. Please contact [facility] at [phone] with questions or concerns regarding  your invoice.   Our billing staff will not be able to assist you with questions regarding bills from these companies.  You will be contacted with the lab results as soon as they are available. The fastest way to get your results is to activate your My Chart account. Instructions are located on the last page of this paperwork. If you have not heard from [phone] regarding the results in 2 weeks, please contact this office.     Health Maintenance, Female Adopting a healthy lifestyle and getting preventive care  are important in promoting health and wellness. Ask your health care provider about:  The right schedule for you to have regular tests and exams.  Things you can do on your own to prevent diseases and keep yourself healthy. What should I know about diet, weight, and exercise? Eat a healthy diet   Eat a diet that includes plenty of vegetables, fruits, low-fat dairy products, and lean protein.  Do not eat a lot of foods that are high in solid fats, added sugars, or sodium. Maintain a healthy weight Body mass index (BMI) is used to identify weight problems. It estimates body fat based on height and weight. Your health care provider can help determine your BMI and help you achieve or maintain a healthy weight. Get regular exercise Get regular exercise. This is one of the most important things you can do for your health. Most adults should:  Exercise for at least 150 minutes each week. The exercise should increase your heart rate and make you sweat (moderate-intensity exercise).  Do strengthening exercises at least twice a week. This is in addition to the moderate-intensity exercise.  Spend less time sitting. Even light physical activity can be beneficial. Watch cholesterol and blood lipids Have your blood tested for lipids and cholesterol at 43 years of age, then have this test every 5 years. Have your cholesterol levels checked more often if:  Your lipid or cholesterol levels are  high.  You are older than 43 years of age.  You are at high risk for heart disease. What should I know about cancer screening? Depending on your health history and family history, you may need to have cancer screening at various ages. This may include screening for:  Breast cancer.  Cervical cancer.  Colorectal cancer.  Skin cancer.  Lung cancer. What should I know about heart disease, diabetes, and high blood pressure? Blood pressure and heart disease  High blood pressure causes heart disease and increases the risk of stroke. This is more likely to develop in people who have high blood pressure readings, are of African descent, or are overweight.  Have your blood pressure checked: ? Every 3-5 years if you are 44-88 years of age. ? Every year if you are 52 years old or older. Diabetes Have regular diabetes screenings. This checks your fasting blood sugar level. Have the screening done:  Once every three years after age 29 if you are at a normal weight and have a low risk for diabetes.  More often and at a younger age if you are overweight or have a high risk for diabetes. What should I know about preventing infection? Hepatitis B If you have a higher risk for hepatitis B, you should be screened for this virus. Talk with your health care provider to find out if you are at risk for hepatitis B infection. Hepatitis C Testing is recommended for:  Everyone born from [birth year] through 1965.  Anyone with known risk factors for hepatitis C. Sexually transmitted infections (STIs)  Get screened for STIs, including gonorrhea and chlamydia, if: ? You are sexually active and are younger than 43 years of age. ? You are older than 43 years of age and your health care provider tells you that you are at risk for this type of infection. ? Your sexual activity has changed since you were last screened, and you are at increased risk for chlamydia or gonorrhea. Ask your health care provider if you  are at risk.  Ask your health care provider  about whether you are at high risk for HIV. Your health care provider may recommend a prescription medicine to help prevent HIV infection. If you choose to take medicine to prevent HIV, you should first get tested for HIV. You should then be tested every 3 months for as long as you are taking the medicine. Pregnancy  If you are about to stop having your period (premenopausal) and you may become pregnant, seek counseling before you get pregnant.  Take 400 to 800 micrograms (mcg) of folic acid every day if you become pregnant.  Ask for birth control (contraception) if you want to prevent pregnancy. Osteoporosis and menopause Osteoporosis is a disease in which the bones lose minerals and strength with aging. This can result in bone fractures. If you are 54 years old or older, or if you are at risk for osteoporosis and fractures, ask your health care provider if you should:  Be screened for bone loss.  Take a calcium or vitamin D supplement to lower your risk of fractures.  Be given hormone replacement therapy (HRT) to treat symptoms of menopause. Follow these instructions at home: Lifestyle  Do not use any products that contain nicotine or tobacco, such as cigarettes, e-cigarettes, and chewing tobacco. If you need help quitting, ask your health care provider.  Do not use street drugs.  Do not share needles.  Ask your health care provider for help if you need support or information about quitting drugs. Alcohol use  Do not drink alcohol if: ? Your health care provider tells you not to drink. ? You are pregnant, may be pregnant, or are planning to become pregnant.  If you drink alcohol: ? Limit how much you use to 0-1 drink a day. ? Limit intake if you are breastfeeding.  Be aware of how much alcohol is in your drink. In the [place] one drink equals one 12 oz bottle of beer (355 mL), one 5 oz glass of wine (148 mL), or one 1 oz glass of hard  liquor (44 mL). General instructions  Schedule regular health, dental, and eye exams.  Stay current with your vaccines.  Tell your health care provider if: ? You often feel depressed. ? You have ever been abused or do not feel safe at home. Summary  Adopting a healthy lifestyle and getting preventive care are important in promoting health and wellness.  Follow your health care provider's instructions about healthy diet, exercising, and getting tested or screened for diseases.  Follow your health care provider's instructions on monitoring your cholesterol and blood pressure. This information is not intended to replace advice given to you by your health care provider. Make sure you discuss any questions you have with your health care provider. Document Revised: 08/11/2018 Document Reviewed: 08/11/2018 Elsevier Patient Education  2020 Elsevier Inc.      Edwina Barth, MD [facility]

## 2020-09-20 ENCOUNTER — Other Ambulatory Visit: Payer: Self-pay

## 2020-09-20 ENCOUNTER — Telehealth (INDEPENDENT_AMBULATORY_CARE_PROVIDER_SITE_OTHER): Payer: Managed Care, Other (non HMO) | Admitting: Emergency Medicine

## 2020-09-20 ENCOUNTER — Encounter: Payer: Self-pay | Admitting: Emergency Medicine

## 2020-09-20 VITALS — Ht 67.0 in | Wt 195.0 lb

## 2020-09-20 DIAGNOSIS — F418 Other specified anxiety disorders: Secondary | ICD-10-CM

## 2020-09-20 MED ORDER — ESCITALOPRAM OXALATE 10 MG PO TABS: 10.0000 mg | ORAL_TABLET | Freq: Every day | ORAL | 1 refills | Status: DC

## 2020-09-20 NOTE — Progress Notes (Signed)
Telemedicine Encounter- SOAP NOTE Established Patient MyChart video conference attempted without success Patient: Robyn Ramirez  Provider: Office     This telephone encounter was conducted with the patient's (or proxy's) verbal consent via audio telecommunications: yes/no: Yes Patient was instructed to have this encounter in a suitably private space; and to only have persons present to whom they give permission to participate. In addition, patient identity was confirmed by use of name plus two identifiers (DOB and address).  I discussed the limitations, risks, security and privacy concerns of performing an evaluation and management service by telephone and the availability of in person appointments. I also discussed with the patient that there may be a patient responsible charge related to this service. The patient expressed understanding and agreed to proceed.  I spent a total of TIME; 0 MIN TO 60 MIN: 20 minutes talking with the patient or their proxy.  Chief Complaint  Patient presents with  . Anxiety    Per patient it has gotten worse over the 3 months    Subjective   Robyn Ramirez is a 45 y.o. female established patient. Telephone visit today complaining of increased anxiety and stress triggered by work conditions for the past several months. States that she can sleep, gets headaches, "on edge", her mind will not rest, is irritable at work. Thinks that she needs time off from work. Needs referral to psychiatry. Will consider starting medications. Inquiring about FMLA. No other complaints or medical concerns today.  HPI   Patient Active Problem List   Diagnosis Date Noted  . 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 Anxiety   . Depression   . Genital HSV   . Hemiplegic migraine   . Hypertension    history of, no medication, resolved with weight  loss  . Trichomonas vaginalis (TV) infection    TR[facility]     Current Outpatient Medications  Medication Sig Dispense Refill  . Ascorbic Acid (VITAMIN C) 100 MG tablet Take 100 mg by mouth daily.    . Cholecalciferol (VITAMIN D3 PO) Take by mouth daily.    Marland Kitchen ECHINACEA PO Take by mouth daily.    . Multiple Vitamin (MULTIVITAMIN) tablet Take 1 tablet by mouth daily.    . Niacin (VITAMIN B-3 PO) Take by mouth daily.    . valACYclovir (VALTREX) 500 MG tablet Take one tablet bid x 3 days as needed 30 tablet 1  . Zinc 100 MG TABS Take by mouth daily.    . nitrofurantoin, macrocrystal-monohydrate, (MACROBID) 100 MG capsule Take 100 mg by mouth 2 (two) times daily. (Patient not taking: Reported on 09/20/2020)     No current facility-administered medications for this visit.    No Known Allergies  Social History   Socioeconomic History  . Marital status: Single    Spouse name: Not on file  . Number of children: 3  . Years of education: Not on file  . Highest education level: Not on file  Occupational History  . Occupation: customer service  Tobacco Use  . Smoking status: Former Smoker    Packs/day: 0.10    Years: 20.00    Pack years: 2.00    Types: Cigarettes    Quit date: 2018    Years since quitting: 4.0  . Smokeless tobacco: Never Used  Vaping Use  . Vaping Use: Never used  Substance and Sexual Activity  .  Alcohol use: Yes    Comment: rarely  . Drug use: No  . Sexual activity: Yes    Partners: Male    Birth control/protection: None, Surgical    Comment: tubal ligation   Other Topics Concern  . Not on file  Social History Narrative   Single, 3 daughters 2 here in [place] 1 in [place]   Customer service rep for [employer]   Former smoker 1 caffeinated beverage a day no tobacco or alcohol or drug use   Social Determinants of Corporate investment banker Strain: Not on file  Food Insecurity: Not on file  Transportation Needs: Not on file  Physical Activity: Not  on file  Stress: Not on file  Social Connections: Not on file  Intimate Partner Violence: Not on file    Review of Systems  Constitutional: Negative.  Negative for chills and fever.  HENT: Negative.  Negative for congestion and sore throat.   Respiratory: Negative.  Negative for cough and shortness of breath.   Cardiovascular: Negative.  Negative for chest pain and palpitations.  Gastrointestinal: Negative.  Negative for abdominal pain, diarrhea, nausea and vomiting.  Genitourinary: Negative.  Negative for dysuria and hematuria.  Musculoskeletal: Negative.  Negative for back pain, joint pain, myalgias and neck pain.  Skin: Negative.   Neurological: Negative.  Negative for dizziness and headaches.  All other systems reviewed and are negative.   Objective  Alert and oriented x3 in no apparent respiratory distress. Vitals as reported by the patient: Today's Vitals   09/20/20 1350  Weight: 195 lb (88.5 kg)  Height: 5\' 7"  (1.702 m)    There are no diagnoses linked to this encounter. Robyn Ramirez was seen today for anxiety.  Diagnoses and all orders for this visit:  Situational anxiety -     Ambulatory referral to Psychiatry -     escitalopram (LEXAPRO) 10 MG tablet; Take 1 tablet (10 mg total) by mouth daily.  Extreme anxiety level triggered by work conditions. May benefit from starting medication. Has past history of depression but never this much anxiety. May benefit from psychiatric evaluation. Referral placed today. Considering FMLA.   I discussed the assessment and treatment plan with the patient. The patient was provided an opportunity to ask questions and all were answered. The patient agreed with the plan and demonstrated an understanding of the instructions.   The patient was advised to call back or seek an in-person evaluation if the symptoms worsen or if the condition fails to improve as anticipated.  I provided 20 minutes of non-face-to-face time during this  encounter.  Veatrice Kells, MD  Primary Care at [facility]

## 2020-09-20 NOTE — Patient Instructions (Signed)
° ° ° °  If you have lab work done today you will be contacted with your lab results within the next 2 weeks.  If you have not heard from us then please contact us. The fastest way to get your results is to register for My Chart. ° ° °IF you received an x-ray today, you will receive an invoice from [facility]. Please contact [facility] at [phone] with questions or concerns regarding your invoice.  ° °IF you received labwork today, you will receive an invoice from [facility]. Please contact [facility] at [phone] with questions or concerns regarding your invoice.  ° °Our billing staff will not be able to assist you with questions regarding bills from these companies. ° °You will be contacted with the lab results as soon as they are available. The fastest way to get your results is to activate your My Chart account. Instructions are located on the last page of this paperwork. If you have not heard from us regarding the results in 2 weeks, please contact this office. °  ° ° ° °

## 2020-10-01 ENCOUNTER — Ambulatory Visit (INDEPENDENT_AMBULATORY_CARE_PROVIDER_SITE_OTHER): Payer: Managed Care, Other (non HMO) | Admitting: Obstetrics and Gynecology

## 2020-10-01 ENCOUNTER — Other Ambulatory Visit: Payer: Self-pay

## 2020-10-01 ENCOUNTER — Encounter: Payer: Self-pay | Admitting: Obstetrics and Gynecology

## 2020-10-01 VITALS — BP 122/70 | HR 80 | Ht 66.5 in | Wt 197.0 lb

## 2020-10-01 DIAGNOSIS — Z113 Encounter for screening for infections with a predominantly sexual mode of transmission: Secondary | ICD-10-CM

## 2020-10-01 DIAGNOSIS — Z01419 Encounter for gynecological examination (general) (routine) without abnormal findings: Secondary | ICD-10-CM | POA: Diagnosis not present

## 2020-10-01 DIAGNOSIS — A6000 Herpesviral infection of urogenital system, unspecified: Secondary | ICD-10-CM

## 2020-10-01 DIAGNOSIS — Z0000 Encounter for general adult medical examination without abnormal findings: Secondary | ICD-10-CM | POA: Diagnosis not present

## 2020-10-01 LAB — CBC
Hemoglobin: 12.4 g/dL (ref 11.7–15.5)
RDW: 12.9 % (ref 11.0–15.0)

## 2020-10-01 MED ORDER — VALACYCLOVIR HCL 500 MG PO TABS: ORAL_TABLET | ORAL | 4 refills | Status: DC

## 2020-10-01 NOTE — Patient Instructions (Signed)

## 2020-10-01 NOTE — Progress Notes (Signed)
45 y.o. [id number] Single Black or African American Not Hispanic or Latino female here for annual exam. patient would like STD testing.  Recently broke up with her fiance. Sexually active with a new partner.  Period Cycle (Days): 28 Period Duration (Days): 3-5 Period Pattern: Regular Menstrual Flow: Light Menstrual Control: Thin pad Menstrual Control Change Freq (Hours): 6 Dysmenorrhea: None (mood swings)   She saw Dr Leone Payor last year. No improvement in her constipation.   She is struggling with anxiety, also has a h/o depression. She is talking to her primary. She is up and down. Primary prescribed lexapro and she hasn't started it. Prefers to avoid medication. Primary referred her to a psychiatrist. She has a therapist.   She is on daily valtrex. No outbreaks.   Patient's last menstrual period was 09/17/2020.          Sexually active: Yes.    The current method of family planning is tubal ligation.    Exercising: Yes.    lift weights Smoker:  no  Health Maintenance: Pap:   09/28/19 ASCUS:Neg HR HPV  01/11/18 Negative:Neg HR HPV  01/16/17 Colposcopy with CIN I  12/08/16 ASC-H:Neg HR HPV History of abnormal Pap:  Yes, squamous cells 11/2016 MMG:  never TDaP:  07/31/18 Gardasil: completed series   reports that she quit smoking about 4 years ago. Her smoking use included cigarettes. She has a 2.00 pack-year smoking history. She has never used smokeless tobacco. She reports current alcohol use. She reports that she does not use drugs. Works for a [employer]. 3 kids, one grown. 35 and 90 years old are at home. 43 year old was suicidal last week.    Past Medical History:  Diagnosis Date  . Anxiety   . Depression   . Genital HSV   . Hemiplegic migraine   . Hypertension    history of, no medication, resolved with weight loss  . Trichomonas vaginalis (TV) infection    [facility]     Past Surgical History:  Procedure Laterality Date  . LAPAROSCOPIC TUBAL LIGATION Bilateral  09/27/2015   Procedure: LAPAROSCOPIC TUBAL LIGATION with Anna Genre CLIPS;  Surgeon: Tereso Newcomer, MD;  Location: [facility];  Service: Gynecology;  Laterality: Bilateral;  . MOUTH SURGERY    . TUBAL LIGATION N/A    [employer] 09/03/2020    Current Outpatient Medications  Medication Sig Dispense Refill  . Ascorbic Acid (VITAMIN C) 100 MG tablet Take 100 mg by mouth daily.    . Cholecalciferol (VITAMIN D3 PO) Take by mouth daily.    Marland Kitchen ECHINACEA PO Take by mouth daily.    . Multiple Vitamin (MULTIVITAMIN) tablet Take 1 tablet by mouth daily.    . Niacin (VITAMIN B-3 PO) Take by mouth daily.    . valACYclovir (VALTREX) 500 MG tablet Take one tablet bid x 3 days as needed 30 tablet 1  . Zinc 100 MG TABS Take by mouth daily.     No current facility-administered medications for this visit.    Family History  Problem Relation Age of Onset  . Hypertension Mother   . Leukemia Mother 49  . Liver cancer Father   . Hypertension Maternal Grandmother   . Cancer Paternal Grandmother        type unknown    Review of Systems  Constitutional: Negative.   HENT: Negative.   Eyes: Negative.   Respiratory: Negative.   Cardiovascular: Negative.   Gastrointestinal: Negative.   Endocrine: Negative.   Genitourinary: Negative.  Musculoskeletal: Negative.   Skin: Negative.   Allergic/Immunologic: Negative.   Neurological: Negative.   Hematological: Negative.   Psychiatric/Behavioral: Negative.     Exam:   BP 122/70 (BP Location: Left Arm, Patient Position: Sitting, Cuff Size: Normal)   Pulse 80   Ht 5' 6.5" (1.689 m)   Wt 197 lb (89.4 kg)   LMP 09/17/2020   BMI 31.32 kg/m   Weight change: @WEIGHTCHANGE @ Height:   Height: 5' 6.5" (168.9 cm)  Ht Readings from Last 3 Encounters:  10/01/20 5' 6.5" (1.689 m)  09/20/20 5\' 7"  (1.702 m)  09/06/20 5\' 6"  (1.676 m)    General appearance: alert, cooperative and appears stated age Head: Normocephalic, without obvious abnormality, atraumatic Neck: no  adenopathy, supple, symmetrical, trachea midline and thyroid normal to inspection and palpation Lungs: clear to auscultation bilaterally Cardiovascular: regular rate and rhythm Breasts: normal appearance, no masses or tenderness Abdomen: soft, non-tender; non distended,  no masses,  no organomegaly Extremities: extremities normal, atraumatic, no cyanosis or edema Skin: Skin color, texture, turgor normal. No rashes or lesions Lymph nodes: Cervical, supraclavicular, and axillary nodes normal. No abnormal inguinal nodes palpated Neurologic: Grossly normal   Pelvic: External genitalia:  no lesions              Urethra:  normal appearing urethra with no masses, tenderness or lesions              Bartholins and Skenes: normal                 Vagina: normal appearing vagina with normal color and discharge, no lesions              Cervix: no lesions               Bimanual Exam:  Uterus:  normal size, contour, position, consistency, mobility, non-tender and retroverted              Adnexa: no mass, fullness, tenderness               Rectovaginal: Confirms               Anus:  normal sphincter tone, no lesions  chaperoned for the exam.   1. Well woman exam Discussed breast self exam Discussed calcium and vit D intake Mammogram recommended, # given   2. Genital herpes simplex, unspecified site On suppression - valACYclovir (VALTREX) 500 MG tablet; Take one tablet daily, increase to bid x 3 days as needed  Dispense: 90 tablet; Refill: 4  3. Laboratory exam ordered as part of routine general medical examination  - CBC - Comprehensive metabolic panel - Lipid panel  4. Screening examination for STD (sexually transmitted disease)  - RPR - HIV Antibody (routine testing w rflx) - SURESWAB CT/NG/T. vaginalis

## 2020-10-02 LAB — COMPREHENSIVE METABOLIC PANEL
AG Ratio: 1.5 (calc) (ref 1.0–2.5)
ALT: 20 U/L (ref 6–29)
AST: 21 U/L (ref 10–30)
Albumin: 4.2 g/dL (ref 3.6–5.1)
Alkaline phosphatase (APISO): 52 U/L (ref 31–125)
BUN: 16 mg/dL (ref 7–25)
CO2: 29 mmol/L (ref 20–32)
Calcium: 9.4 mg/dL (ref 8.6–10.2)
Chloride: 105 mmol/L (ref 98–110)
Creat: 0.95 mg/dL (ref 0.50–1.10)
Globulin: 2.8 g/dL (calc) (ref 1.9–3.7)
Glucose, Bld: 76 mg/dL (ref 65–99)
Potassium: 4 mmol/L (ref 3.5–5.3)
Sodium: 141 mmol/L (ref 135–146)
Total Bilirubin: 0.4 mg/dL (ref 0.2–1.2)
Total Protein: 7 g/dL (ref 6.1–8.1)

## 2020-10-02 LAB — CBC
HCT: 36.9 % (ref 35.0–45.0)
MCH: 32.4 pg (ref 27.0–33.0)
MCHC: 33.6 g/dL (ref 32.0–36.0)
MCV: 96.3 fL (ref 80.0–100.0)
MPV: 10.6 fL (ref 7.5–12.5)
Platelets: 293 10*3/uL (ref 140–400)
RBC: 3.83 10*6/uL (ref 3.80–5.10)
WBC: 7.1 10*3/uL (ref 3.8–10.8)

## 2020-10-02 LAB — LIPID PANEL
Cholesterol: 154 mg/dL (ref <200)
HDL: 54 mg/dL (ref >=50)
LDL Cholesterol (Calc): 75 mg/dL (calc)
Non-HDL Cholesterol (Calc): 100 mg/dL (calc) (ref <130)
Total CHOL/HDL Ratio: 2.9 (calc) (ref <5.0)
Triglycerides: 145 mg/dL (ref <150)

## 2020-10-02 LAB — HIV ANTIBODY (ROUTINE TESTING W REFLEX): HIV 1&2 Ab, 4th Generation: NONREACTIVE

## 2020-10-02 LAB — RPR: RPR Ser Ql: NONREACTIVE

## 2020-10-02 LAB — SURESWAB CT/NG/T. VAGINALIS
C. trachomatis RNA, TMA: NOT DETECTED
N. gonorrhoeae RNA, TMA: NOT DETECTED
Trichomonas vaginalis RNA: NOT DETECTED

## 2020-12-04 ENCOUNTER — Other Ambulatory Visit: Payer: Self-pay

## 2020-12-06 ENCOUNTER — Other Ambulatory Visit: Payer: Self-pay

## 2020-12-06 ENCOUNTER — Telehealth: Payer: Self-pay | Admitting: *Deleted

## 2020-12-06 ENCOUNTER — Encounter: Payer: Self-pay | Admitting: Emergency Medicine

## 2020-12-06 ENCOUNTER — Ambulatory Visit: Payer: Managed Care, Other (non HMO) | Admitting: Emergency Medicine

## 2020-12-06 VITALS — BP 120/88 | HR 68 | Temp 99.0°F | Ht 67.0 in | Wt 197.4 lb

## 2020-12-06 DIAGNOSIS — F4323 Adjustment disorder with mixed anxiety and depressed mood: Secondary | ICD-10-CM | POA: Diagnosis not present

## 2020-12-06 DIAGNOSIS — F331 Major depressive disorder, recurrent, moderate: Secondary | ICD-10-CM | POA: Diagnosis not present

## 2020-12-06 MED ORDER — FLUOXETINE HCL 20 MG PO TABS: 20.0000 mg | ORAL_TABLET | Freq: Every day | ORAL | 3 refills | Status: DC

## 2020-12-06 NOTE — Progress Notes (Signed)
Situational anxiety -     Ambulatory referral to Psychiatry -     escitalopram (LEXAPRO) 10 MG tablet; Take 1 tablet (10 mg total) by mouth daily.  Extreme anxiety level triggered by work conditions. May benefit from starting medication. Has past history of depression but never this much anxiety. May benefit from psychiatric evaluation. Referral placed today. Considering FMLA.  Robyn Ramirez 45 y.o.   Chief Complaint  Patient presents with  . Follow-up    Discuss mental health    HISTORY OF PRESENT ILLNESS: This is a 45 y.o. female with a history of chronic depression for years.  Has been seen by psychiatrists in the past.  Has been on different medications.  Prozac works the best.  Presently having active depression episode. [facility] 12/06/2020 09/20/2020 09/06/2020 12/31/2017 10/21/2017  Decreased Interest 3 1 0 0 2  Down, Depressed, Hopeless 3 1 0 0 2  PHQ - 2 Score 6 2 0 0 4  Altered sleeping 3 3 - - 3  Tired, decreased energy 3 3 - - 3  Change in appetite 2 1 - - 3  Feeling bad or failure about yourself  3 2 - - 2  Trouble concentrating 3 2 - - 2  Moving slowly or fidgety/restless 3 2 - - 2  Suicidal thoughts 1 0 - - 1  PHQ-9 Score 24 15 - - 20  Difficult doing work/chores - Very difficult - - Very difficult     HPI   Prior to Admission medications   Medication Sig Start Date End Date Taking? Authorizing Provider  Ascorbic Acid (VITAMIN C) 100 MG tablet Take 100 mg by mouth daily.    [provider]  Cholecalciferol (VITAMIN D3 PO) Take by mouth daily.    [provider]  ECHINACEA PO Take by mouth daily.    [provider]  Multiple Vitamin (MULTIVITAMIN) tablet Take 1 tablet by mouth daily.    [provider]  Niacin (VITAMIN B-3 PO) Take by mouth daily.    [provider]  valACYclovir (VALTREX) 500 MG tablet Take one tablet daily, increase to bid x 3 days as needed 10/01/20   Romualdo BolkJertson, Jill Evelyn, MD   Zinc 100 MG TABS Take by mouth daily.    [provider]    No Known Allergies  Patient Active Problem List   Diagnosis Date Noted  . 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 Anxiety   . Depression   . Genital HSV   . Hemiplegic migraine   . Hypertension    history of, no medication, resolved with weight loss  . Trichomonas vaginalis (TV) infection    [facility]     Past Surgical History:  Procedure Laterality Date  . LAPAROSCOPIC TUBAL LIGATION Bilateral 09/27/2015   Procedure: LAPAROSCOPIC TUBAL LIGATION with Anna GenreFILSHE CLIPS;  Surgeon: Tereso NewcomerUgonna A Anyanwu, MD;  Location: [facility];  Service: Gynecology;  Laterality: Bilateral;  . MOUTH SURGERY    . TUBAL LIGATION N/A    [employer] 09/03/2020    Social History   Socioeconomic History  . Marital status: Single    Spouse name: Not on file  . Number of children: 3  . Years of education: Not on file  . Highest education level: Not on file  Occupational History  . Occupation: customer service  Tobacco Use  . Smoking status: Former  Smoker    Packs/day: 0.10    Years: 20.00    Pack years: 2.00    Types: Cigarettes    Quit date: 2018    Years since quitting: 4.2  . Smokeless tobacco: Never Used  Vaping Use  . Vaping Use: Never used  Substance and Sexual Activity  . Alcohol use: Yes    Comment: rarely  . Drug use: No  . Sexual activity: Yes    Partners: Male    Birth control/protection: None, Surgical    Comment: tubal ligation   Other Topics Concern  . Not on file  Social History Narrative   Single, 3 daughters 2 here in [place] 1 in [place]   Customer service rep for [employer]   Former smoker 1 caffeinated beverage a day no tobacco or alcohol or drug use   Social Determinants of Corporate investment banker Strain: Not on file  Food Insecurity: Not on file   Transportation Needs: Not on file  Physical Activity: Not on file  Stress: Not on file  Social Connections: Not on file  Intimate Partner Violence: Not on file    Family History  Problem Relation Age of Onset  . Hypertension Mother   . Leukemia Mother 62  . Liver cancer Father   . Hypertension Maternal Grandmother   . Cancer Paternal Grandmother        type unknown     Review of Systems  Constitutional: Negative.  Negative for chills and fever.  HENT: Negative.  Negative for congestion and sore throat.   Respiratory: Negative.  Negative for cough and shortness of breath.   Cardiovascular: Negative.  Negative for chest pain and palpitations.  Gastrointestinal: Negative.  Negative for abdominal pain, diarrhea, nausea and vomiting.  Genitourinary: Negative.  Negative for dysuria and hematuria.  Skin: Negative.  Negative for rash.  Neurological: Negative.  Negative for dizziness and headaches.  Psychiatric/Behavioral: Positive for depression. Negative for suicidal ideas. The patient is nervous/anxious.   All other systems reviewed and are negative.   Today's Vitals   12/06/20 0906  BP: 120/88  Pulse: 68  Temp: 99 F (37.2 C)  TempSrc: Oral  Weight: 197 lb 6.4 oz (89.5 kg)  Height: 5\' 7"  (1.702 m)   Body mass index is 30.92 kg/m.  Physical Exam Vitals reviewed.  Constitutional:      Appearance: Normal appearance.  HENT:     Head: Normocephalic.  Eyes:     Extraocular Movements: Extraocular movements intact.     Pupils: Pupils are equal, round, and reactive to light.  Cardiovascular:     Rate and Rhythm: Normal rate and regular rhythm.     Pulses: Normal pulses.     Heart sounds: Normal heart sounds.  Pulmonary:     Effort: Pulmonary effort is normal.     Breath sounds: Normal breath sounds.  Musculoskeletal:        General: Normal range of motion.     Cervical back: Normal range of motion and neck supple.  Skin:    General: Skin is warm and dry.      Capillary Refill: Capillary refill takes less than 2 seconds.  Neurological:     General: No focal deficit present.     Mental Status: She is alert and oriented to person, place, and time.  Psychiatric:        Mood and Affect: Mood normal.        Behavior: Behavior normal.  ASSESSMENT & PLAN: Moderate episode of recurrent major depressive disorder (HCC) Past history of chronic depression.  Presently having active episode. Prozac has worked best in the past for.  We will start 20 mg daily. Needs urgent referral to behavioral health services.  Not suicidal. We will follow-up after psychiatric evaluation.   Robyn Ramirez was seen today for follow-up.  Diagnoses and all orders for this visit:  Moderate episode of recurrent major depressive disorder (HCC) -     FLUoxetine (PROZAC) 20 MG tablet; Take 1 tablet (20 mg total) by mouth daily. -     Ambulatory referral to Psychology  Situational mixed anxiety and depressive disorder     Patient Instructions   Major Depressive Disorder, Adult Major depressive disorder is a mental health condition. This disorder affects feelings. It can also affect the body. Symptoms of this condition last most of the day, almost every day, for 2 weeks. This disorder can affect:  Relationships.  Daily activities, such as work and school.  Activities that you normally like to do. What are the causes? The cause of this condition is not known. The disorder is likely caused by a mix of things, including:  Your personality, such as being a shy person.  Your behavior, or how you act toward others.  Your thoughts and feelings.  Too much alcohol or drugs.  How you react to stress.  Health and mental problems that you have had for a long time.  Things that hurt you in the past (trauma).  Big changes in your life, such as divorce. What increases the risk? The following factors may make you more likely to develop this condition:  Having family  members with depression.  Being a woman.  Problems in the family.  Low levels of some brain chemicals.  Things that caused you pain as a child, especially if you lost a parent or were abused.  A lot of stress in your life, such as from: ? Living without basic needs of life, such as food and shelter. ? Being treated poorly because of race, sex, or religion (discrimination).  Health and mental problems that you have had for a long time. What are the signs or symptoms? The main symptoms of this condition are:  Being sad all the time.  Being grouchy all the time.  Loss of interest in things and activities. Other symptoms include:  Sleeping too much or too little.  Eating too much or too little.  Gaining or losing weight, without knowing why.  Feeling tired or having low energy.  Being restless and weak.  Feeling hopeless, worthless, or guilty.  Trouble thinking clearly or making decisions.  Thoughts of hurting yourself or others, or thoughts of ending your life.  Spending a lot of time alone.  Inability to complete common tasks of daily life. If you have very bad MDD, you may:  Believe things that are not true.  Hear, see, taste, or feel things that are not there.  Have mild depression that lasts for at least 2 years.  Feel very sad and hopeless.  Have trouble speaking or moving. How is this treated? This condition may be treated with:  Talk therapy. This teaches you to know bad thoughts, feelings, and actions and how to change them. ? This can also help you to communicate with others. ? This can be done with members of your family.  Medicines. These can be used to treat worry (anxiety), depression, or low levels of chemicals in the brain.  Lifestyle changes. You may need to: ? Limit alcohol use. ? Limit drug use. ? Get regular exercise. ? Get plenty of sleep. ? Make healthy eating choices. ? Spend more time outdoors.  Brain stimulation. This  treatment excites the brain. This is done when symptoms are very bad or have not gotten better with other treatments. Follow these instructions at home: Activity  Get regular exercise as told.  Spend time outdoors as told.  Make time to do the things you enjoy.  Find ways to deal with stress. Try to: ? Meditate. ? Do deep breathing. ? Spend time in nature. ? Keep a journal.  Return to your normal activities as told by your doctor. Ask your doctor what activities are safe for you. Alcohol and drug use  If you drink alcohol: ? Limit how much you use to:  0-1 drink a day for women.  0-2 drinks a day for men. ? Be aware of how much alcohol is in your drink. In the [place] one drink equals one 12 oz bottle of beer (355 mL), one 5 oz glass of wine (148 mL), or one 1 oz glass of hard liquor (44 mL).  Talk to your doctor about: ? Alcohol use. Alcohol can affect some medicines. ? Any drug use. General instructions  Take over-the-counter and prescription medicines and herbal preparations only as told by your doctor.  Eat a healthy diet.  Get a lot of sleep.  Think about joining a support group. Your doctor may be able to suggest one.  Keep all follow-up visits as told by your doctor. This is important.   Where to find more information:  [employer] on Mental Illness: www.nami.org  [place] [employer] of Mental Health: http://www.maynard.net/  American Psychiatric Association: www.psychiatry.org/patients-families/ Contact a doctor if:  Your symptoms get worse.  You get new symptoms. Get help right away if:  You hurt yourself.  You have serious thoughts about hurting yourself or others.  You see, hear, taste, smell, or feel things that are not there. If you ever feel like you may hurt yourself or others, or have thoughts about taking your own life, get help right away. Go to your nearest emergency department or:  Call your local emergency services (911 in the  [place]  Call a suicide crisis helpline, such as the National Suicide Prevention Lifeline at 620 875 1589. This is open 24 hours a day in the [place]  Text the Crisis Text Line at 847-411-2454 (in the [place] Summary  Major depressive disorder is a mental health condition. This disorder affects feelings. Symptoms of this condition last most of the day, almost every day, for 2 weeks.  The symptoms of this disorder can cause problems with relationships and with daily activities.  There are treatments and support for people who get this disorder. You may need more than one type of treatment.  Get help right away if you have serious thoughts about hurting yourself or others. This information is not intended to replace advice given to you by your health care provider. Make sure you discuss any questions you have with your health care provider. Document Revised: 07/30/2019 Document Reviewed: 07/30/2019 Elsevier Patient Education  2021 Elsevier Inc.     Edwina Barth, MD [facility]

## 2020-12-06 NOTE — Telephone Encounter (Signed)
Spoke to patient to her know [employer] does not have anything for a few weeks instead of asap. She can be seen at the new [facility] health office in [place]  On [address] as walk in. I gave her the address and phone number.

## 2020-12-06 NOTE — Assessment & Plan Note (Signed)
Past history of chronic depression.  Presently having active episode. Prozac has worked best in the past for.  We will start 20 mg daily. Needs urgent referral to behavioral health services.  Not suicidal. We will follow-up after psychiatric evaluation.

## 2020-12-06 NOTE — Patient Instructions (Signed)
Major Depressive Disorder, Adult Major depressive disorder is a mental health condition. This disorder affects feelings. It can also affect the body. Symptoms of this condition last most of the day, almost every day, for 2 weeks. This disorder can affect:  Relationships.  Daily activities, such as work and school.  Activities that you normally like to do. What are the causes? The cause of this condition is not known. The disorder is likely caused by a mix of things, including:  Your personality, such as being a shy person.  Your behavior, or how you act toward others.  Your thoughts and feelings.  Too much alcohol or drugs.  How you react to stress.  Health and mental problems that you have had for a long time.  Things that hurt you in the past (trauma).  Big changes in your life, such as divorce. What increases the risk? The following factors may make you more likely to develop this condition:  Having family members with depression.  Being a woman.  Problems in the family.  Low levels of some brain chemicals.  Things that caused you pain as a child, especially if you lost a parent or were abused.  A lot of stress in your life, such as from: ? Living without basic needs of life, such as food and shelter. ? Being treated poorly because of race, sex, or religion (discrimination).  Health and mental problems that you have had for a long time. What are the signs or symptoms? The main symptoms of this condition are:  Being sad all the time.  Being grouchy all the time.  Loss of interest in things and activities. Other symptoms include:  Sleeping too much or too little.  Eating too much or too little.  Gaining or losing weight, without knowing why.  Feeling tired or having low energy.  Being restless and weak.  Feeling hopeless, worthless, or guilty.  Trouble thinking clearly or making decisions.  Thoughts of hurting yourself or others, or thoughts of  ending your life.  Spending a lot of time alone.  Inability to complete common tasks of daily life. If you have very bad MDD, you may:  Believe things that are not true.  Hear, see, taste, or feel things that are not there.  Have mild depression that lasts for at least 2 years.  Feel very sad and hopeless.  Have trouble speaking or moving. How is this treated? This condition may be treated with:  Talk therapy. This teaches you to know bad thoughts, feelings, and actions and how to change them. ? This can also help you to communicate with others. ? This can be done with members of your family.  Medicines. These can be used to treat worry (anxiety), depression, or low levels of chemicals in the brain.  Lifestyle changes. You may need to: ? Limit alcohol use. ? Limit drug use. ? Get regular exercise. ? Get plenty of sleep. ? Make healthy eating choices. ? Spend more time outdoors.  Brain stimulation. This treatment excites the brain. This is done when symptoms are very bad or have not gotten better with other treatments. Follow these instructions at home: Activity  Get regular exercise as told.  Spend time outdoors as told.  Make time to do the things you enjoy.  Find ways to deal with stress. Try to: ? Meditate. ? Do deep breathing. ? Spend time in nature. ? Keep a journal.  Return to your normal activities as told by your doctor.   Ask your doctor what activities are safe for you. Alcohol and drug use  If you drink alcohol: ? Limit how much you use to:  0-1 drink a day for women.  0-2 drinks a day for men. ? Be aware of how much alcohol is in your drink. In the [place] one drink equals one 12 oz bottle of beer (355 mL), one 5 oz glass of wine (148 mL), or one 1 oz glass of hard liquor (44 mL).  Talk to your doctor about: ? Alcohol use. Alcohol can affect some medicines. ? Any drug use. General instructions  Take over-the-counter and prescription medicines  and herbal preparations only as told by your doctor.  Eat a healthy diet.  Get a lot of sleep.  Think about joining a support group. Your doctor may be able to suggest one.  Keep all follow-up visits as told by your doctor. This is important.   Where to find more information:  National Alliance on Mental Illness: www.nami.org  [place] National Institute of Mental Health: www.nimh.nih.gov  American Psychiatric Association: www.psychiatry.org/patients-families/ Contact a doctor if:  Your symptoms get worse.  You get new symptoms. Get help right away if:  You hurt yourself.  You have serious thoughts about hurting yourself or others.  You see, hear, taste, smell, or feel things that are not there. If you ever feel like you may hurt yourself or others, or have thoughts about taking your own life, get help right away. Go to your nearest emergency department or:  Call your local emergency services (911 in the [place]  Call a suicide crisis helpline, such as the National Suicide Prevention Lifeline at 1-800-273-8255. This is open 24 hours a day in the [place]  Text the Crisis Text Line at 741741 (in the [place] Summary  Major depressive disorder is a mental health condition. This disorder affects feelings. Symptoms of this condition last most of the day, almost every day, for 2 weeks.  The symptoms of this disorder can cause problems with relationships and with daily activities.  There are treatments and support for people who get this disorder. You may need more than one type of treatment.  Get help right away if you have serious thoughts about hurting yourself or others. This information is not intended to replace advice given to you by your health care provider. Make sure you discuss any questions you have with your health care provider. Document Revised: 07/30/2019 Document Reviewed: 07/30/2019 Elsevier Patient Education  2021 Elsevier Inc.  

## 2020-12-07 ENCOUNTER — Telehealth: Payer: Self-pay | Admitting: Emergency Medicine

## 2020-12-07 NOTE — Telephone Encounter (Signed)
I received [employer] Disability forms.  Leave Start 12/06/20 to 01/20/21 LOV: 12/07/20  Forms have been placed in providers box to review and sign if approved.  [facility] forms as well - Patient is waiting an appointment with that provider.

## 2020-12-13 DIAGNOSIS — Z0279 Encounter for issue of other medical certificate: Secondary | ICD-10-CM

## 2020-12-13 NOTE — Telephone Encounter (Signed)
Forms have been signed, Faxed to [employer] @ [phone], Copy sent to scan &Charged for.   LVM to inform patient, Original mailed to patient for her records.

## 2020-12-18 ENCOUNTER — Ambulatory Visit (HOSPITAL_COMMUNITY)
Admission: EM | Admit: 2020-12-18 | Discharge: 2020-12-18 | Disposition: A | Discharge status: Home / Self Care | Payer: Managed Care, Other (non HMO)

## 2020-12-18 ENCOUNTER — Other Ambulatory Visit: Payer: Self-pay

## 2020-12-18 DIAGNOSIS — F331 Major depressive disorder, recurrent, moderate: Secondary | ICD-10-CM
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
## 2020-12-18 NOTE — Discharge Instructions (Addendum)
Dear Ms. Orson Aloe,  You presented to urgent care because you were asked by your primary care physician to see outpatient psychiatry and therapy. Please continue taking your Prozac as prescribed by your primary care provider. Please make an appointment for outpatient psychiatry and therapy from the resources provided by our social worker.   Also. --Report any adverse effects and or reactions from the medicines to his/her outpatient provider promptly. --Do not engage in alcohol and or illegal drug use while on prescription medicines. --In the event of worsening symptoms, patient is instructed to call the crisis hotline, 911 and or go to the nearest ED for appropriate evaluation and treatment of symptoms. --To follow-up with his/her primary care provider for your other medical issues, concerns and or health care needs.    Take care! Dr. Gerarda Fraction

## 2020-12-18 NOTE — Progress Notes (Signed)
Pt to [facility] voluntarily with chief complaint of worsening depression. Pt was referred by her PCP to come for services due to her presentation during appointment a couple weeks ago. Pt reports struggling to make it here today and she reports that she is on leave at work due to depression. Pt denies SI, HI, AVH and substance use.   Pt is routine.

## 2020-12-18 NOTE — Discharge Summary (Signed)
[name] to be D/C'd home per MD order. Discussed with the patient and all questions fully answered. An After Visit Summary was printed and given to the patient. Patient escorted out and D/C home via private auto.  Robyn Ramirez  12/18/2020 2:12 PM

## 2021-01-14 ENCOUNTER — Telehealth: Payer: Self-pay | Admitting: Emergency Medicine

## 2021-01-14 NOTE — Telephone Encounter (Signed)
Type of form received: FMLA   Additional comments: Received on fax  Received by: [place] R.     Form should be Faxed to: [phone]   Form should be mailed to:  [address] [id number], [place], [address]  Is patient requesting call for pickup: N/A   Form placed in the Provider's box.  **Attach charge sheet.  Provider will determine charge.**  Was patient informed of  7-10 business day turn around (Y/N)? N

## 2021-01-15 NOTE — Telephone Encounter (Signed)
Called and spoke with pt,she states that she would like an extension on FMLA. Pt states that she is looking to find a behavioral health clinic for further treatment.

## 2021-01-15 NOTE — Telephone Encounter (Signed)
Extension is ok with me. Thanks.

## 2021-01-16 NOTE — Telephone Encounter (Signed)
Received FMLA forms 01/14/21 from provider's box. FMLA forms filled out 01/16/21 and waiting for Dr. Latrelle Dodrill review.

## 2021-01-22 DIAGNOSIS — Z0279 Encounter for issue of other medical certificate: Secondary | ICD-10-CM

## 2021-01-22 NOTE — Telephone Encounter (Signed)
FMLA forms signed 01/21/21 and faxed to employer 01/22/21. Pt notified via Mychart.

## 2021-05-02 ENCOUNTER — Institutional Professional Consult (permissible substitution): Payer: Self-pay | Admitting: Plastic Surgery

## 2021-09-23 ENCOUNTER — Emergency Department (HOSPITAL_COMMUNITY): Payer: 59

## 2021-09-23 ENCOUNTER — Encounter (HOSPITAL_COMMUNITY): Payer: Self-pay

## 2021-09-23 ENCOUNTER — Emergency Department (HOSPITAL_COMMUNITY)
Admission: EM | Admit: 2021-09-23 | Discharge: 2021-09-23 | Disposition: A | Discharge status: Home / Self Care | Payer: 59 | Attending: Emergency Medicine | Admitting: Emergency Medicine

## 2021-09-23 ENCOUNTER — Other Ambulatory Visit: Payer: Self-pay

## 2021-09-23 DIAGNOSIS — R1110 Vomiting, unspecified: Secondary | ICD-10-CM | POA: Diagnosis not present

## 2021-09-23 DIAGNOSIS — R1111 Vomiting without nausea: Secondary | ICD-10-CM | POA: Diagnosis not present

## 2021-09-23 DIAGNOSIS — R42 Dizziness and giddiness: Secondary | ICD-10-CM

## 2021-09-23 DIAGNOSIS — R110 Nausea: Secondary | ICD-10-CM | POA: Diagnosis not present

## 2021-09-23 LAB — URINALYSIS, ROUTINE W REFLEX MICROSCOPIC
Bilirubin Urine: NEGATIVE
Glucose, UA: NEGATIVE mg/dL
Ketones, ur: NEGATIVE mg/dL
Leukocytes,Ua: NEGATIVE
Nitrite: NEGATIVE
Protein, ur: 30 mg/dL — AB
RBC / HPF: >50 RBC/hpf — ABNORMAL HIGH (ref 0–5)
Specific Gravity, Urine: 1.014 (ref 1.005–1.030)
pH: 9 — ABNORMAL HIGH (ref 5.0–8.0)

## 2021-09-23 LAB — CBG MONITORING, ED: Glucose-Capillary: 80 mg/dL (ref 70–99)

## 2021-09-23 LAB — CBC
HCT: 39.8 % (ref 36.0–46.0)
Hemoglobin: 13.3 g/dL (ref 12.0–15.0)
MCH: 31.3 pg (ref 26.0–34.0)
MCHC: 33.4 g/dL (ref 30.0–36.0)
MCV: 93.6 fL (ref 80.0–100.0)
Platelets: 288 10*3/uL (ref 150–400)
RBC: 4.25 MIL/uL (ref 3.87–5.11)
RDW: 13.1 % (ref 11.5–15.5)
WBC: 5.4 10*3/uL (ref 4.0–10.5)
nRBC: 0 % (ref 0.0–0.2)

## 2021-09-23 LAB — COMPREHENSIVE METABOLIC PANEL
ALT: 20 U/L (ref 0–44)
AST: 30 U/L (ref 15–41)
Albumin: 4 g/dL (ref 3.5–5.0)
Alkaline Phosphatase: 50 U/L (ref 38–126)
Anion gap: 6 (ref 5–15)
BUN: 14 mg/dL (ref 6–20)
CO2: 25 mmol/L (ref 22–32)
Calcium: 8.7 mg/dL — ABNORMAL LOW (ref 8.9–10.3)
Chloride: 105 mmol/L (ref 98–111)
Creatinine, Ser: 0.84 mg/dL (ref 0.44–1.00)
GFR, Estimated: >60 mL/min (ref >60)
Glucose, Bld: 94 mg/dL (ref 70–99)
Potassium: 4 mmol/L (ref 3.5–5.1)
Sodium: 136 mmol/L (ref 135–145)
Total Bilirubin: 1.1 mg/dL (ref 0.3–1.2)
Total Protein: 7.5 g/dL (ref 6.5–8.1)

## 2021-09-23 LAB — I-STAT BETA HCG BLOOD, ED (MC, WL, AP ONLY): I-stat hCG, quantitative: <5 m[IU]/mL (ref <5)

## 2021-09-23 LAB — LIPASE, BLOOD: Lipase: 26 U/L (ref 11–51)

## 2021-09-23 MED ORDER — ONDANSETRON 4 MG PO TBDP: 4.0000 mg | ORAL_TABLET | Freq: Once | ORAL | Status: DC

## 2021-09-23 MED ORDER — LACTATED RINGERS IV BOLUS
1000.0000 mL | Freq: Once | INTRAVENOUS | Status: AC
  Administered 2021-09-23: 1000 mL via INTRAVENOUS

## 2021-09-23 MED ORDER — MECLIZINE HCL 25 MG PO TABS: 25.0000 mg | ORAL_TABLET | Freq: Three times a day (TID) | ORAL | 0 refills | Status: DC | PRN

## 2021-09-23 MED ORDER — MECLIZINE HCL 25 MG PO TABS
25.0000 mg | ORAL_TABLET | Freq: Once | ORAL | Status: AC
  Administered 2021-09-23: 25 mg via ORAL
  Filled 2021-09-23: qty 1

## 2021-09-23 MED ORDER — LORAZEPAM 2 MG/ML IJ SOLN
1.0000 mg | Freq: Once | INTRAMUSCULAR | Status: AC
  Administered 2021-09-23: 1 mg via INTRAVENOUS
  Filled 2021-09-23: qty 1

## 2021-09-23 NOTE — ED Triage Notes (Signed)
Pt BIB EMS from home. Pt endorses dizziness and N/V that began around 630am this morning. Pt reports dizziness is worse with movement.   BP 138/70 HR 68 RR 18 CBG 99  20G RAC Zofran 4mg 

## 2021-09-23 NOTE — ED Provider Notes (Signed)
Patient presents with room spinning dizziness and difficulty ambulating due to dizziness.  Work-up is unremarkable signed out to me is pending reassessment after Ativan.  Patient subsequently tolerated meclizine and her dizziness appears well controlled.  She is laboratory without assistance symptoms appear very mild now.  CT imaging of the brain performed with no acute findings.  I did discuss with the patient MRI imaging, however she has metal implants in her face and does not wish to remove them for MRI study.  Given her symptoms are improved, joint decision made to forego additional imaging.  Patient advised outpatient follow-up with neurology within the week.  Advised immediate return for worsening symptoms or any additional concerns.   Cheryll Cockayne, MD 09/23/21 2010

## 2021-09-23 NOTE — Discharge Instructions (Signed)
Call your primary care doctor or specialist as discussed in the next 2-3 days.   Return immediately back to the ER if:  Your symptoms worsen within the next 12-24 hours. You develop new symptoms such as new fevers, persistent vomiting, new pain, shortness of breath, or new weakness or numbness, or if you have any other concerns.  

## 2021-09-23 NOTE — ED Notes (Signed)
Ambulated patient. Pt stated she felt sideways/ a little dizzy.

## 2021-09-23 NOTE — ED Provider Triage Note (Signed)
Emergency Medicine Provider Triage Evaluation Note  Robyn Ramirez , a 46 y.o. female  was evaluated in triage.  Pt complains of dizziness, nausea, and vomiting.  Started at 630 this morning and have been constant since then.  Patient states that dizziness is worse with change in position and with standing.  Review of Systems  Positive: Dizziness, nausea, vomiting Negative: Numbness, weakness, facial asymmetry, dysarthria, abdominal pain  Physical Exam  BP 138/82 (BP Location: Left Arm)    Pulse 71    Temp 98.3 F (36.8 C) (Oral)    Resp 16    SpO2 100%  Gen:   Awake, no distress   Resp:  Normal effort  MSK:   Moves extremities without difficulty  Other:  No dysarthria or facial asymmetry.  Patient moves all limbs equally without difficulty.  Abdomen soft, nondistended, nontender.  Medical Decision Making  Medically screening exam initiated at 11:27 AM.  Appropriate orders placed.  Robyn Ramirez was informed that the remainder of the evaluation will be completed by another provider, this initial triage assessment does not replace that evaluation, and the importance of remaining in the ED until their evaluation is complete.     Loni Beckwith, [place] 09/23/21 1129

## 2021-09-23 NOTE — ED Provider Notes (Signed)
[facility]-EMERGENCY DEPT Provider Note   CSN: [id number] Arrival date & time: 09/23/21  1104     History  Chief Complaint  Patient presents with   Dizziness   Emesis    Robyn Ramirez is a 46 y.o. female.  The history is provided by the patient.  Dizziness Quality:  Room spinning and vertigo Severity:  Severe Onset quality:  Sudden Duration:  12 hours Timing:  Constant Progression:  Unchanged Chronicity:  New Context comment:  Started when she went to get up this morning Relieved by: It is better when she closes her eyes and does not move. Worsened by:  Movement and standing up Ineffective treatments:  None tried Associated symptoms: vomiting   Associated symptoms: no headaches, no hearing loss, no shortness of breath and no weakness   Associated symptoms comment:  When her eyes are open everything is moving in her visual field and she feels that her vision is a little blurry Risk factors comment:  No known medical problems.  No prior history of similar symptoms. Emesis Associated symptoms: no headaches       Home Medications Prior to Admission medications   Medication Sig Start Date End Date Taking? Authorizing Provider  Ascorbic Acid (VITAMIN C) 100 MG tablet Take 100 mg by mouth daily.    [provider]  Cholecalciferol (VITAMIN D3 PO) Take by mouth daily.    [provider]  ECHINACEA PO Take by mouth daily.    [provider]  FLUoxetine (PROZAC) 20 MG tablet Take 1 tablet (20 mg total) by mouth daily. 12/06/20   Georgina Quint, MD  Multiple Vitamin (MULTIVITAMIN) tablet Take 1 tablet by mouth daily.    [provider]  Niacin (VITAMIN B-3 PO) Take by mouth daily.    [provider]  valACYclovir (VALTREX) 500 MG tablet Take one tablet daily, increase to bid x 3 days as needed 10/01/20   Romualdo Bolk, MD  Zinc 100 MG TABS Take by mouth daily.    [provider]       Allergies    Patient has no known allergies.    Review of Systems   Review of Systems  HENT:  Negative for hearing loss.   Respiratory:  Negative for shortness of breath.   Gastrointestinal:  Positive for vomiting.  Neurological:  Positive for dizziness. Negative for weakness and headaches.   Physical Exam Updated Vital Signs BP (!) 145/74    Pulse 70    Temp 98.3 F (36.8 C) (Oral)    Resp 19    SpO2 99%  Physical Exam Vitals and nursing note reviewed.  Constitutional:      General: She is not in acute distress.    Appearance: Normal appearance. She is well-developed.  HENT:     Head: Normocephalic and atraumatic.  Eyes:     Pupils: Pupils are equal, round, and reactive to light.     Comments: Nystagmus noted with extraocular movements which are intact  Cardiovascular:     Rate and Rhythm: Normal rate and regular rhythm.     Heart sounds: Normal heart sounds. No murmur heard.   No friction rub.  Pulmonary:     Effort: Pulmonary effort is normal.     Breath sounds: Normal breath sounds. No wheezing or rales.  Abdominal:     General: Bowel sounds are normal. There is no distension.     Palpations: Abdomen is soft.     Tenderness: There is  no abdominal tenderness. There is no guarding or rebound.  Musculoskeletal:        General: No tenderness. Normal range of motion.     Cervical back: Normal range of motion and neck supple. No tenderness.     Comments: No edema  Skin:    General: Skin is warm and dry.     Findings: No rash.  Neurological:     Mental Status: She is alert and oriented to person, place, and time. Mental status is at baseline.     Cranial Nerves: No cranial nerve deficit.     Sensory: No sensory deficit.     Motor: No weakness or pronator drift.     Coordination: Coordination normal. Heel to Shin Test normal.  Psychiatric:        Mood and Affect: Mood normal.        Behavior: Behavior normal.    ED Results / Procedures / Treatments   Labs (all  labs ordered are listed, but only abnormal results are displayed) Labs Reviewed  COMPREHENSIVE METABOLIC PANEL - Abnormal; Notable for the following components:      Result Value   Calcium 8.7 (*)    All other components within normal limits  URINALYSIS, ROUTINE W REFLEX MICROSCOPIC - Abnormal; Notable for the following components:   APPearance CLOUDY (*)    pH 9.0 (*)    Hgb urine dipstick LARGE (*)    Protein, ur 30 (*)    RBC / HPF >50 (*)    Bacteria, UA FEW (*)    All other components within normal limits  LIPASE, BLOOD  CBC  I-STAT BETA HCG BLOOD, ED ([facility], [facility], [facility] ONLY)  CBG MONITORING, ED    EKG EKG Interpretation  Date/Time:  Monday September 23 2021 11:21:28 EST Ventricular Rate:  69 PR Interval:  132 QRS Duration: 84 QT Interval:  387 QTC Calculation: 415 R Axis:   86 Text Interpretation: Sinus rhythm RAE, consider biatrial enlargement Artifact No significant change since last tracing Confirmed by Gwyneth Sprout ([id number]) on 09/23/2021 12:55:27 PM  Radiology No results found.  Procedures Procedures    Medications Ordered in ED Medications  LORazepam (ATIVAN) injection 1 mg (1 mg Intravenous Given 09/23/21 1338)  lactated ringers bolus 1,000 mL (1,000 mLs Intravenous New Bag/Given 09/23/21 1326)    ED Course/ Medical Decision Making/ A&P                           Medical Decision Making Amount and/or Complexity of Data Reviewed Labs: ordered.  Risk Prescription drug management.   Pt with sx most consistent with peripheral vertigo.  No systemic or infectious sx.  Normal neuro exam without weakness, ataxia or cerebellar findings on exam.  Normal vision.  Sx are reproducible with movement of the head and attempting to walk.  No hx of Stroke and low likelihood.  No risk factors and normal VS. Will treat for peripheral vertigo and re-eval. Pt given ativan and reports some residual mild dizziness but much improved.  Pt will need to ambulate to ensure no ataxia  prior to d/c but if ataxic will need further imaging.  I independently evaluated patient's labs which were done prior to being evaluated and she has a normal CMP, normal lipase, negative hCG, normal CBC and blood glucose.  EKG which I independently evaluated today with no acute findings.  Findings discussed with the patient and her family member who are in the room.  Final Clinical Impression(s) / ED Diagnoses Final diagnoses:  None    Rx / DC Orders ED Discharge Orders     None         Gwyneth SproutPlunkett, Raeven Pint, MD 09/23/21 1531

## 2021-09-24 MED ORDER — FAMOTIDINE IN NACL 20-0.9 MG/50ML-% IV SOLN
INTRAVENOUS | Status: AC
  Filled 2021-09-24: qty 50

## 2021-09-24 MED ORDER — EPINEPHRINE 0.3 MG/0.3ML IJ SOAJ
INTRAMUSCULAR | Status: AC
  Filled 2021-09-24: qty 0.3

## 2021-09-24 MED ORDER — DIPHENHYDRAMINE HCL 50 MG/ML IJ SOLN
INTRAMUSCULAR | Status: AC
  Filled 2021-09-24: qty 1

## 2021-10-07 ENCOUNTER — Other Ambulatory Visit: Payer: Self-pay | Admitting: Emergency Medicine

## 2021-10-07 DIAGNOSIS — F418 Other specified anxiety disorders: Secondary | ICD-10-CM

## 2022-01-05 ENCOUNTER — Other Ambulatory Visit: Payer: Self-pay | Admitting: Emergency Medicine

## 2022-01-05 DIAGNOSIS — F331 Major depressive disorder, recurrent, moderate: Secondary | ICD-10-CM

## 2022-02-11 ENCOUNTER — Ambulatory Visit: Payer: 59 | Admitting: Emergency Medicine

## 2022-02-18 ENCOUNTER — Ambulatory Visit: Payer: 59 | Admitting: Emergency Medicine

## 2022-02-25 ENCOUNTER — Ambulatory Visit (INDEPENDENT_AMBULATORY_CARE_PROVIDER_SITE_OTHER): Payer: 59 | Admitting: Emergency Medicine

## 2022-02-25 ENCOUNTER — Encounter: Payer: Self-pay | Admitting: Emergency Medicine

## 2022-02-25 VITALS — BP 126/84 | HR 71 | Temp 98.6°F | Ht 67.0 in | Wt 197.2 lb

## 2022-02-25 DIAGNOSIS — A6000 Herpesviral infection of urogenital system, unspecified: Secondary | ICD-10-CM | POA: Diagnosis not present

## 2022-02-25 DIAGNOSIS — F331 Major depressive disorder, recurrent, moderate: Secondary | ICD-10-CM

## 2022-02-25 DIAGNOSIS — R69 Illness, unspecified: Secondary | ICD-10-CM | POA: Diagnosis not present

## 2022-02-25 DIAGNOSIS — F5104 Psychophysiologic insomnia: Secondary | ICD-10-CM

## 2022-02-25 MED ORDER — VALACYCLOVIR HCL 500 MG PO TABS: ORAL_TABLET | ORAL | 4 refills | Status: DC

## 2022-02-25 MED ORDER — FLUOXETINE HCL 20 MG PO TABS: 20.0000 mg | ORAL_TABLET | Freq: Every day | ORAL | 3 refills | Status: DC

## 2022-02-25 MED ORDER — HYDROXYZINE HCL 25 MG PO TABS: 25.0000 mg | ORAL_TABLET | Freq: Every evening | ORAL | 1 refills | Status: DC | PRN

## 2022-02-25 NOTE — Assessment & Plan Note (Signed)
Sleep hygiene discussed with patient. Over-the-counter supplements not helpful. We will try Vistaril 25 mg at bedtime.

## 2023-09-25 ENCOUNTER — Ambulatory Visit: Payer: 59 | Admitting: Physician Assistant

## 2023-09-25 NOTE — Progress Notes (Deleted)
 09/25/2023 Robyn Ramirez [id number] 1976/08/12  Referring provider: Georgina Quint, * Primary GI doctor: {acdocs:[id number]}  ASSESSMENT AND PLAN:   Assessment and Plan              Patient Care Team: Georgina Quint, MD as PCP - General (Internal Medicine)  HISTORY OF PRESENT ILLNESS: 48 y.o. female with a past medical history of depression, insomnia, migraines and others listed below presents for evaluation of ***.   Patient previously seen in the office 2021 for chronic constipation over 20 years with right lower quadrant abdominal discomfort, associated bloating, nausea.  Unremarkable gynecological exam no prior colonoscopy or family history of colon cancer. Tested for SIBO started on MiraLAX Most recent labs reviewed as September 23, 2021 no anemia noGnosis, negative lipase, liver function normal.  Discussed the use of AI scribe software for clinical note transcription with the patient, who gave verbal consent to proceed.  History of Present Illness             She  reports that she quit smoking about 7 years ago. Her smoking use included cigarettes. She started smoking about 27 years ago. She has a 2 pack-year smoking history. She has never used smokeless tobacco. She reports current alcohol use. She reports that she does not use drugs.  RELEVANT GI HISTORY, LABS, IMAGING:  CBC    Component Value Date/Time   WBC 5.4 09/23/2021 1125   RBC 4.25 09/23/2021 1125   HGB 13.3 09/23/2021 1125   HGB 13.1 09/28/2019 1553   HCT 39.8 09/23/2021 1125   HCT 38.8 09/28/2019 1553   PLT 288 09/23/2021 1125   PLT 272 09/28/2019 1553   MCV 93.6 09/23/2021 1125   MCV 94 09/28/2019 1553   MCH 31.3 09/23/2021 1125   MCHC 33.4 09/23/2021 1125   RDW 13.1 09/23/2021 1125   RDW 12.4 09/28/2019 1553   LYMPHSABS 2.9 05/06/2018 2107   MONOABS 0.7 05/06/2018 2107   EOSABS 0.1 05/06/2018 2107   BASOSABS 0.0 05/06/2018 2107   No results for input(s): "HGB" in the  last 8760 hours.  CMP     Component Value Date/Time   NA 136 09/23/2021 1125   NA 140 09/28/2019 1553   K 4.0 09/23/2021 1125   CL 105 09/23/2021 1125   CO2 25 09/23/2021 1125   GLUCOSE 94 09/23/2021 1125   BUN 14 09/23/2021 1125   BUN 14 09/28/2019 1553   CREATININE 0.84 09/23/2021 1125   CREATININE 0.95 10/01/2020 1555   CALCIUM 8.7 (L) 09/23/2021 1125   PROT 7.5 09/23/2021 1125   PROT 7.4 09/28/2019 1553   ALBUMIN 4.0 09/23/2021 1125   ALBUMIN 4.5 09/28/2019 1553   AST 30 09/23/2021 1125   ALT 20 09/23/2021 1125   ALKPHOS 50 09/23/2021 1125   BILITOT 1.1 09/23/2021 1125   BILITOT 0.4 09/28/2019 1553   GFRNONAA >60 09/23/2021 1125   GFRAA 85 09/28/2019 1553      Latest Ref Rng & Units 09/23/2021   11:25 AM 10/01/2020    3:55 PM 09/28/2019    3:53 PM  Hepatic Function  Total Protein 6.5 - 8.1 g/dL 7.5  7.0  7.4   Albumin 3.5 - 5.0 g/dL 4.0   4.5   AST 15 - 41 U/L 30  21  22    ALT 0 - 44 U/L 20  20  19    Alk Phosphatase 38 - 126 U/L 50   62   Total Bilirubin 0.3 - 1.2 mg/dL  1.1  0.4  0.4       Current Medications:        Current Outpatient Medications (Other):    Ascorbic Acid (VITAMIN C) 100 MG tablet, Take 100 mg by mouth daily.   Cholecalciferol (VITAMIN D3 PO), Take by mouth daily.   ECHINACEA PO, Take by mouth daily.   FLUoxetine (PROZAC) 20 MG tablet, Take 1 tablet (20 mg total) by mouth daily.   hydrOXYzine (ATARAX) 25 MG tablet, Take 1 tablet (25 mg total) by mouth at bedtime as needed.   meclizine (ANTIVERT) 25 MG tablet, Take 1 tablet (25 mg total) by mouth 3 (three) times daily as needed for dizziness.   Multiple Vitamin (MULTIVITAMIN) tablet, Take 1 tablet by mouth daily.   Niacin (VITAMIN B-3 PO), Take by mouth daily.   valACYclovir (VALTREX) 500 MG tablet, Take one tablet daily, increase to bid x 3 days as needed   Zinc 100 MG TABS, Take by mouth daily.  Medical History:  Past Medical History:  Diagnosis Date   Anxiety    Depression     Genital HSV    Hemiplegic migraine    Hypertension    history of, no medication, resolved with weight loss   Trichomonas vaginalis (TV) infection    TRICH    Allergies: No Known Allergies   Surgical History:  She  has a past surgical history that includes Mouth surgery; Laparoscopic tubal ligation (Bilateral, 09/27/2015); and Tubal ligation (N/A). Family History:  Her family history includes Cancer in her paternal grandmother; Hypertension in her maternal grandmother and mother; Leukemia (age of onset: 30) in her mother; Liver cancer in her father.  REVIEW OF SYSTEMS  : All other systems reviewed and negative except where noted in the History of Present Illness.  PHYSICAL EXAM: There were no vitals taken for this visit. General Appearance: Well nourished, in no apparent distress. Head:   Normocephalic and atraumatic. Eyes:  sclerae anicteric,conjunctive pink  Respiratory: Respiratory effort normal, BS equal bilaterally without rales, rhonchi, wheezing. Cardio: RRR with no MRGs. Peripheral pulses intact.  Abdomen: Soft,  {BlankSingle:19197::"Flat","Obese","Non-distended"} ,active bowel sounds. {actendernessAB:27319} tenderness {anatomy; site abdomen:5010}. {BlankMultiple:19196::"Without guarding","With guarding","Without rebound","With rebound"}. No masses. Rectal: {acrectalexam:27461} Musculoskeletal: Full ROM, {PSY - GAIT AND STATION:22860} gait. {With/Without:[id number]} edema. Skin:  Dry and intact without significant lesions or rashes Neuro: Alert and  oriented x4;  No focal deficits. Psych:  Cooperative. Normal mood and affect.    Doree Albee, PA-C 7:42 AM

## 2024-03-31 ENCOUNTER — Ambulatory Visit (INDEPENDENT_AMBULATORY_CARE_PROVIDER_SITE_OTHER): Payer: Self-pay | Admitting: Emergency Medicine

## 2024-03-31 ENCOUNTER — Encounter: Payer: Self-pay | Admitting: Emergency Medicine

## 2024-03-31 VITALS — BP 110/80 | HR 99 | Temp 98.3°F | Ht 67.0 in | Wt 179.0 lb

## 2024-03-31 DIAGNOSIS — Z1231 Encounter for screening mammogram for malignant neoplasm of breast: Secondary | ICD-10-CM

## 2024-03-31 DIAGNOSIS — Z130 Encounter for screening for diseases of the blood and blood-forming organs and certain disorders involving the immune mechanism: Secondary | ICD-10-CM | POA: Diagnosis not present

## 2024-03-31 DIAGNOSIS — Z1211 Encounter for screening for malignant neoplasm of colon: Secondary | ICD-10-CM

## 2024-03-31 DIAGNOSIS — Z0000 Encounter for general adult medical examination without abnormal findings: Secondary | ICD-10-CM

## 2024-03-31 DIAGNOSIS — Z13220 Encounter for screening for lipoid disorders: Secondary | ICD-10-CM

## 2024-03-31 DIAGNOSIS — Z13228 Encounter for screening for other metabolic disorders: Secondary | ICD-10-CM | POA: Diagnosis not present

## 2024-03-31 DIAGNOSIS — Z1329 Encounter for screening for other suspected endocrine disorder: Secondary | ICD-10-CM

## 2024-03-31 LAB — COMPREHENSIVE METABOLIC PANEL WITH GFR
ALT: 12 U/L (ref 0–35)
AST: 14 U/L (ref 0–37)
Albumin: 4.3 g/dL (ref 3.5–5.2)
Alkaline Phosphatase: 69 U/L (ref 39–117)
BUN: 12 mg/dL (ref 6–23)
CO2: 31 meq/L (ref 19–32)
Calcium: 9.1 mg/dL (ref 8.4–10.5)
Chloride: 104 meq/L (ref 96–112)
Creatinine, Ser: 0.74 mg/dL (ref 0.40–1.20)
GFR: 97.05 mL/min (ref >60.00)
Glucose, Bld: 74 mg/dL (ref 70–99)
Potassium: 4 meq/L (ref 3.5–5.1)
Sodium: 140 meq/L (ref 135–145)
Total Bilirubin: 0.4 mg/dL (ref 0.2–1.2)
Total Protein: 7.3 g/dL (ref 6.0–8.3)

## 2024-03-31 LAB — LIPID PANEL
Cholesterol: 139 mg/dL (ref 0–200)
HDL: 50.2 mg/dL (ref >39.00)
LDL Cholesterol: 72 mg/dL (ref 0–99)
NonHDL: 88.86
Total CHOL/HDL Ratio: 3
Triglycerides: 82 mg/dL (ref 0.0–149.0)
VLDL: 16.4 mg/dL (ref 0.0–40.0)

## 2024-03-31 LAB — CBC WITH DIFFERENTIAL/PLATELET
Basophils Absolute: 0 K/uL (ref 0.0–0.1)
Basophils Relative: 0.3 % (ref 0.0–3.0)
Eosinophils Absolute: 0 K/uL (ref 0.0–0.7)
Eosinophils Relative: 0.6 % (ref 0.0–5.0)
HCT: 36.1 % (ref 36.0–46.0)
Hemoglobin: 12 g/dL (ref 12.0–15.0)
Lymphocytes Relative: 29.7 % (ref 12.0–46.0)
Lymphs Abs: 1.7 K/uL (ref 0.7–4.0)
MCHC: 33.3 g/dL (ref 30.0–36.0)
MCV: 93.5 fl (ref 78.0–100.0)
Monocytes Absolute: 0.4 K/uL (ref 0.1–1.0)
Monocytes Relative: 8 % (ref 3.0–12.0)
Neutro Abs: 3.4 K/uL (ref 1.4–7.7)
Neutrophils Relative %: 61.4 % (ref 43.0–77.0)
Platelets: 294 K/uL (ref 150.0–400.0)
RBC: 3.85 Mil/uL — ABNORMAL LOW (ref 3.87–5.11)
RDW: 14 % (ref 11.5–15.5)
WBC: 5.6 K/uL (ref 4.0–10.5)

## 2024-03-31 LAB — VITAMIN B12: Vitamin B-12: 696 pg/mL (ref 211–911)

## 2024-03-31 LAB — HEMOGLOBIN A1C: Hgb A1c MFr Bld: 5.3 % (ref 4.6–6.5)

## 2024-03-31 NOTE — Patient Instructions (Signed)

## 2024-03-31 NOTE — Progress Notes (Signed)
 Robyn Ramirez 48 y.o.   Chief Complaint  Patient presents with   Annual Exam    HISTORY OF PRESENT ILLNESS: This is a 48 y.o. female here for annual exam Healthy lifestyle.  Eats well and exercises regularly. Has no complaints or medical concerns today.  HPI   Prior to Admission medications   Medication Sig Start Date End Date Taking? Authorizing Provider  Semaglutide-Weight Management 0.5 MG/0.5ML SOAJ Inject 0.7 mg into the skin.   Yes [provider]  valACYclovir  (VALTREX ) 500 MG tablet Take one tablet daily, increase to bid x 3 days as needed 02/25/22  Yes Jashiya Bassett, Emil Schanz, MD  meclizine  (ANTIVERT ) 25 MG tablet Take 1 tablet (25 mg total) by mouth 3 (three) times daily as needed for dizziness. Patient not taking: Reported on 03/31/2024 09/23/21   Phebe Fonda RAMAN, MD    No Known Allergies  Patient Active Problem List   Diagnosis Date Noted   Chronic insomnia 02/25/2022   Moderate episode of recurrent major depressive disorder (HCC) 12/06/2020    Past Medical History:  Diagnosis Date   Anxiety    Depression    Genital HSV    Hemiplegic migraine    Hypertension    history of, no medication, resolved with weight loss   Trichomonas vaginalis (TV) infection    TRICH     Past Surgical History:  Procedure Laterality Date   LAPAROSCOPIC TUBAL LIGATION Bilateral 09/27/2015   Procedure: LAPAROSCOPIC TUBAL LIGATION with ASBERRY CLIPS;  Surgeon: Gloris DELENA Hugger, MD;  Location: [facility];  Service: Gynecology;  Laterality: Bilateral;   MOUTH SURGERY     TUBAL LIGATION N/A    [employer] 09/03/2020    Social History   Socioeconomic History   Marital status: Single    Spouse name: Not on file   Number of children: 3   Years of education: Not on file   Highest education level: Not on file  Occupational History   Occupation: customer service  Tobacco Use   Smoking status: Former    Current packs/day: 0.00    Average packs/day: 0.1 packs/day for 20.0 years  (2.0 ttl pk-yrs)    Types: Cigarettes    Start date: 57    Quit date: 2018    Years since quitting: 7.5   Smokeless tobacco: Never  Vaping Use   Vaping status: Never Used  Substance and Sexual Activity   Alcohol use: Yes    Comment: rarely   Drug use: No   Sexual activity: Yes    Partners: Male    Birth control/protection: None, Surgical    Comment: tubal ligation   Other Topics Concern   Not on file  Social History Narrative   Single, 3 daughters 2 here in [place] 1 in [place]   Customer service rep for [employer]   Former smoker 1 caffeinated beverage a day no tobacco or alcohol or drug use   Social Drivers of Corporate investment banker Strain: Not on file  Food Insecurity: Not on file  Transportation Needs: Not on file  Physical Activity: Not on file  Stress: Not on file  Social Connections: Not on file  Intimate Partner Violence: Not on file    Family History  Problem Relation Age of Onset   Hypertension Mother    Leukemia Mother 31   Liver cancer Father    Hypertension Maternal Grandmother    Cancer Paternal Grandmother        type unknown  Review of Systems  Constitutional: Negative.  Negative for chills and fever.  HENT: Negative.  Negative for congestion and sore throat.   Respiratory: Negative.  Negative for cough and shortness of breath.   Cardiovascular: Negative.  Negative for chest pain and palpitations.  Gastrointestinal:  Negative for abdominal pain, diarrhea, nausea and vomiting.  Genitourinary: Negative.  Negative for dysuria and hematuria.  Skin: Negative.  Negative for rash.  Neurological: Negative.  Negative for dizziness and headaches.  All other systems reviewed and are negative.   Vitals:   03/31/24 1550  BP: 110/80  Pulse: 99  Temp: 98.3 F (36.8 C)  SpO2: 95%    Physical Exam Vitals reviewed.  Constitutional:      Appearance: Normal appearance.  HENT:     Head: Normocephalic.     Right Ear: Tympanic  membrane, ear canal and external ear normal.     Left Ear: Tympanic membrane, ear canal and external ear normal.     Mouth/Throat:     Mouth: Mucous membranes are moist.     Pharynx: Oropharynx is clear.  Eyes:     Extraocular Movements: Extraocular movements intact.     Conjunctiva/sclera: Conjunctivae normal.     Pupils: Pupils are equal, round, and reactive to light.  Cardiovascular:     Rate and Rhythm: Normal rate and regular rhythm.     Pulses: Normal pulses.     Heart sounds: Normal heart sounds.  Pulmonary:     Effort: Pulmonary effort is normal.     Breath sounds: Normal breath sounds.  Abdominal:     Palpations: Abdomen is soft.     Tenderness: There is no abdominal tenderness.  Musculoskeletal:     Cervical back: No tenderness.  Lymphadenopathy:     Cervical: No cervical adenopathy.  Skin:    General: Skin is warm and dry.     Capillary Refill: Capillary refill takes less than 2 seconds.  Neurological:     General: No focal deficit present.     Mental Status: She is alert and oriented to person, place, and time.  Psychiatric:        Mood and Affect: Mood normal.        Behavior: Behavior normal.      ASSESSMENT & PLAN: Problem List Items Addressed This Visit   None Visit Diagnoses       Routine general medical examination at a health care facility    -  Primary   Relevant Orders   CBC with Differential/Platelet   Comprehensive metabolic panel with GFR   Hemoglobin A1c   Lipid panel   VITAMIN D 25 Hydroxy (Vit-D Deficiency, Fractures)   Vitamin B12     Visit for screening mammogram       Relevant Orders   Mammogram Digital Screening     Colon cancer screening       Relevant Orders   Ambulatory referral to Gastroenterology     Screening for deficiency anemia       Relevant Orders   CBC with Differential/Platelet     Screening for lipoid disorders       Relevant Orders   Lipid panel     Screening for endocrine, metabolic and immunity disorder        Relevant Orders   Comprehensive metabolic panel with GFR   Hemoglobin A1c   VITAMIN D 25 Hydroxy (Vit-D Deficiency, Fractures)   Vitamin B12     Modifiable risk factors discussed with patient. Anticipatory guidance according  to age provided. The following topics were also discussed: Social Determinants of Health Smoking.  Non-smoker Diet and nutrition Benefits of exercise Cancer screening and need for colon and breast cancer screening with colonoscopy and mammogram Vaccinations review recommendations Cardiovascular risk assessment and need for blood work Mental health including depression and anxiety Fall and accident prevention  Patient Instructions  Health Maintenance, Female Adopting a healthy lifestyle and getting preventive care are important in promoting health and wellness. Ask your health care provider about: The right schedule for you to have regular tests and exams. Things you can do on your own to prevent diseases and keep yourself healthy. What should I know about diet, weight, and exercise? Eat a healthy diet  Eat a diet that includes plenty of vegetables, fruits, low-fat dairy products, and lean protein. Do not eat a lot of foods that are high in solid fats, added sugars, or sodium. Maintain a healthy weight Body mass index (BMI) is used to identify weight problems. It estimates body fat based on height and weight. Your health care provider can help determine your BMI and help you achieve or maintain a healthy weight. Get regular exercise Get regular exercise. This is one of the most important things you can do for your health. Most adults should: Exercise for at least 150 minutes each week. The exercise should increase your heart rate and make you sweat (moderate-intensity exercise). Do strengthening exercises at least twice a week. This is in addition to the moderate-intensity exercise. Spend less time sitting. Even light physical activity can be  beneficial. Watch cholesterol and blood lipids Have your blood tested for lipids and cholesterol at 46 years of age, then have this test every 5 years. Have your cholesterol levels checked more often if: Your lipid or cholesterol levels are high. You are older than 47 years of age. You are at high risk for heart disease. What should I know about cancer screening? Depending on your health history and family history, you may need to have cancer screening at various ages. This may include screening for: Breast cancer. Cervical cancer. Colorectal cancer. Skin cancer. Lung cancer. What should I know about heart disease, diabetes, and high blood pressure? Blood pressure and heart disease High blood pressure causes heart disease and increases the risk of stroke. This is more likely to develop in people who have high blood pressure readings or are overweight. Have your blood pressure checked: Every 3-5 years if you are 62-42 years of age. Every year if you are 57 years old or older. Diabetes Have regular diabetes screenings. This checks your fasting blood sugar level. Have the screening done: Once every three years after age 23 if you are at a normal weight and have a low risk for diabetes. More often and at a younger age if you are overweight or have a high risk for diabetes. What should I know about preventing infection? Hepatitis B If you have a higher risk for hepatitis B, you should be screened for this virus. Talk with your health care provider to find out if you are at risk for hepatitis B infection. Hepatitis C Testing is recommended for: Everyone born from [birth year] through 1963. Anyone with known risk factors for hepatitis C. Sexually transmitted infections (STIs) Get screened for STIs, including gonorrhea and chlamydia, if: You are sexually active and are younger than 46 years of age. You are older than 46 years of age and your health care provider tells you that you are at risk for  this type of infection. Your sexual activity has changed since you were last screened, and you are at increased risk for chlamydia or gonorrhea. Ask your health care provider if you are at risk. Ask your health care provider about whether you are at high risk for HIV. Your health care provider may recommend a prescription medicine to help prevent HIV infection. If you choose to take medicine to prevent HIV, you should first get tested for HIV. You should then be tested every 3 months for as long as you are taking the medicine. Pregnancy If you are about to stop having your period (premenopausal) and you may become pregnant, seek counseling before you get pregnant. Take 400 to 800 micrograms (mcg) of folic acid every day if you become pregnant. Ask for birth control (contraception) if you want to prevent pregnancy. Osteoporosis and menopause Osteoporosis is a disease in which the bones lose minerals and strength with aging. This can result in bone fractures. If you are 14 years old or older, or if you are at risk for osteoporosis and fractures, ask your health care provider if you should: Be screened for bone loss. Take a calcium or vitamin D supplement to lower your risk of fractures. Be given hormone replacement therapy (HRT) to treat symptoms of menopause. Follow these instructions at home: Alcohol use Do not drink alcohol if: Your health care provider tells you not to drink. You are pregnant, may be pregnant, or are planning to become pregnant. If you drink alcohol: Limit how much you have to: 0-1 drink a day. Know how much alcohol is in your drink. In the [place] one drink equals one 12 oz bottle of beer (355 mL), one 5 oz glass of wine (148 mL), or one 1 oz glass of hard liquor (44 mL). Lifestyle Do not use any products that contain nicotine or tobacco. These products include cigarettes, chewing tobacco, and vaping devices, such as e-cigarettes. If you need help quitting, ask your health  care provider. Do not use street drugs. Do not share needles. Ask your health care provider for help if you need support or information about quitting drugs. General instructions Schedule regular health, dental, and eye exams. Stay current with your vaccines. Tell your health care provider if: You often feel depressed. You have ever been abused or do not feel safe at home. Summary Adopting a healthy lifestyle and getting preventive care are important in promoting health and wellness. Follow your health care provider's instructions about healthy diet, exercising, and getting tested or screened for diseases. Follow your health care provider's instructions on monitoring your cholesterol and blood pressure. This information is not intended to replace advice given to you by your health care provider. Make sure you discuss any questions you have with your health care provider. Document Revised: 01/07/2021 Document Reviewed: 01/07/2021 Elsevier Patient Education  2024 [employer]vier [employer]     Emil Schaumann, MD [facility]

## 2024-04-01 ENCOUNTER — Ambulatory Visit: Payer: Self-pay | Admitting: Emergency Medicine

## 2024-04-01 LAB — VITAMIN D 25 HYDROXY (VIT D DEFICIENCY, FRACTURES): VITD: 29.56 ng/mL — ABNORMAL LOW (ref 30.00–100.00)

## 2024-04-18 ENCOUNTER — Ambulatory Visit

## 2024-04-19 ENCOUNTER — Encounter: Payer: Self-pay | Admitting: Obstetrics and Gynecology

## 2024-04-19 ENCOUNTER — Other Ambulatory Visit (HOSPITAL_COMMUNITY)
Admission: RE | Admit: 2024-04-19 | Discharge: 2024-04-19 | Disposition: A | Discharge status: Home / Self Care | Source: Ambulatory Visit | Attending: Obstetrics and Gynecology | Admitting: Obstetrics and Gynecology

## 2024-04-19 ENCOUNTER — Ambulatory Visit (INDEPENDENT_AMBULATORY_CARE_PROVIDER_SITE_OTHER): Payer: Self-pay | Admitting: Obstetrics and Gynecology

## 2024-04-19 VITALS — BP 118/62 | HR 81 | Temp 98.2°F | Ht 67.25 in | Wt 176.0 lb

## 2024-04-19 DIAGNOSIS — Z1331 Encounter for screening for depression: Secondary | ICD-10-CM

## 2024-04-19 DIAGNOSIS — A6000 Herpesviral infection of urogenital system, unspecified: Secondary | ICD-10-CM

## 2024-04-19 DIAGNOSIS — Z124 Encounter for screening for malignant neoplasm of cervix: Secondary | ICD-10-CM | POA: Insufficient documentation

## 2024-04-19 DIAGNOSIS — Z01419 Encounter for gynecological examination (general) (routine) without abnormal findings: Secondary | ICD-10-CM | POA: Diagnosis not present

## 2024-04-19 MED ORDER — VALACYCLOVIR HCL 500 MG PO TABS: ORAL_TABLET | ORAL | 4 refills | Status: AC

## 2024-04-19 NOTE — Assessment & Plan Note (Signed)
 Cervical cancer screening performed according to ASCCP guidelines. Encouraged annual mammogram screening- due Colonoscopy duee  Labs and immunizations with her primary Encouraged safe sexual practices as indicated Encouraged healthy lifestyle practices with diet and exercise For patients under 46yo, I recommend 1000mg  calcium daily and 600IU of vitamin D  daily.

## 2024-04-19 NOTE — Patient Instructions (Signed)

## 2024-04-19 NOTE — Progress Notes (Signed)
 48 y.o. [id number] female s/p BTL, hx of CIN-1 (2018) here for annual exam. Single. Self employed Research scientist (physical sciences).  Patient's last menstrual period was 03/30/2024 (exact date). Period Duration (Days): 3-5 Period Pattern: Regular Menstrual Flow: Light Menstrual Control: Maxi pad Dysmenorrhea: (!) Mild Dysmenorrhea Symptoms: Nausea, Diarrhea, Headache  She reports no other concerns today. Urine sample provided: No  Abnormal bleeding: none Pelvic discharge or pain: none Breast mass, nipple discharge or skin changes : none  Sexually active: Yes Birth control: tubal ligation History of abnormal Pap smears: Yes CIN-1 (2018) Last PAP:     Component Value Date/Time   DIAGPAP (A) 09/28/2019 1545    - Atypical squamous cells of undetermined significance (ASC-US )   DIAGPAP  01/27/2018 0000    NEGATIVE FOR INTRAEPITHELIAL LESIONS OR MALIGNANCY.   DIAGPAP (A) 12/08/2016 0000    ATYPICAL SQUAMOUS CELLS CANNOT EXCLUDE A HIGH GRADE SQUAMOUS INTRAEPITHELIAL LESION (ASC-H).   HPVHIGH Negative 09/28/2019 1545   ADEQPAP  09/28/2019 1545    Satisfactory for evaluation; transformation zone component PRESENT.   ADEQPAP  01/27/2018 0000    Satisfactory for evaluation  endocervical/transformation zone component PRESENT.   ADEQPAP (A) 12/08/2016 0000    Satisfactory for evaluation  endocervical/transformation zone component PRESENT.  Gardasil: complete  Last mammogram: never, info provided Last colonoscopy: never, referred by PCP  Exercising: yes, strength training and walking 5 days a week Smoker: No, former  Garment/textile technologist Visit from 04/19/2024 in [facility]  PHQ-2 Total Score 0    Flowsheet Row Office Visit from 03/31/2024 in [facility] [place]  PHQ-9 Total Score 5    GYN HISTORY: H/o abnl PAP, 2018  OB History  Gravida Para Term Preterm AB Living  8 3 3  5 3   SAB IAB Ectopic Multiple Live Births  1 4        # Outcome Date GA Lbr Len/2nd Weight Sex Type Anes PTL Lv  8 IAB           7 IAB           6 IAB           5 IAB           4 SAB           3 Term      Vag-Spont     2 Term      Vag-Spont     1 Term      Vag-Spont      Past Medical History:  Diagnosis Date   Anxiety    Depression    Genital HSV    Hemiplegic migraine    Hypertension    history of, no medication, resolved with weight loss   Trichomonas vaginalis (TV) infection    TRICH    Past Surgical History:  Procedure Laterality Date   LAPAROSCOPIC TUBAL LIGATION Bilateral 09/27/2015   Procedure: LAPAROSCOPIC TUBAL LIGATION with FILSHE CLIPS;  Surgeon: Gloris DELENA Hugger, MD;  Location: [facility];  Service: Gynecology;  Laterality: Bilateral;   MOUTH SURGERY     TUBAL LIGATION N/A    Phreesia 09/03/2020   Current Outpatient Medications on File Prior to Visit  Medication Sig Dispense Refill   Semaglutide-Weight Management 0.5 MG/0.5ML SOAJ Inject 0.7 mg into the skin.     VITAMIN D  PO Take by mouth daily.     No current facility-administered medications on file prior to visit.   Social  History   Socioeconomic History   Marital status: Single    Spouse name: Not on file   Number of children: 3   Years of education: Not on file   Highest education level: Not on file  Occupational History   Occupation: customer service  Tobacco Use   Smoking status: Former    Current packs/day: 0.00    Average packs/day: 0.1 packs/day for 20.0 years (2.0 ttl pk-yrs)    Types: Cigarettes    Start date: 61    Quit date: 2018    Years since quitting: 7.6   Smokeless tobacco: Never  Vaping Use   Vaping status: Never Used  Substance and Sexual Activity   Alcohol use: Not Currently    Comment: rarely   Drug use: No   Sexual activity: Yes    Partners: Male    Birth control/protection: None, Surgical    Comment: tubal ligation   Other Topics Concern   Not on file  Social History Narrative   Single, 3 daughters 2 here in  [place] 1 in [place]   Customer service rep for [employer]   Former smoker 1 caffeinated beverage a day no tobacco or alcohol or drug use   Social Drivers of Corporate investment banker Strain: Not on file  Food Insecurity: Not on file  Transportation Needs: Not on file  Physical Activity: Not on file  Stress: Not on file  Social Connections: Not on file  Intimate Partner Violence: Not on file   Family History  Problem Relation Age of Onset   Hypertension Mother    Leukemia Mother 35   Liver cancer Father    Hypertension Maternal Grandmother    Cancer Paternal Grandmother        type unknown   No Known Allergies   PE Today's Vitals   04/19/24 1332  BP: 118/62  Pulse: 81  Temp: 98.2 F (36.8 C)  TempSrc: Oral  SpO2: 99%  Weight: 176 lb (79.8 kg)  Height: 5' 7.25 (1.708 m)   Body mass index is 27.36 kg/m.  Physical Exam Vitals reviewed. Exam conducted with a chaperone present.  Constitutional:      General: She is not in acute distress.    Appearance: Normal appearance.  HENT:     Head: Normocephalic and atraumatic.     Nose: Nose normal.  Eyes:     Extraocular Movements: Extraocular movements intact.     Conjunctiva/sclera: Conjunctivae normal.  Neck:     Thyroid : No thyroid  mass, thyromegaly or thyroid  tenderness.  Pulmonary:     Effort: Pulmonary effort is normal.  Chest:     Chest wall: No mass or tenderness.  Breasts:    Right: Normal. No swelling, mass, nipple discharge, skin change or tenderness.     Left: Normal. No swelling, mass, nipple discharge, skin change or tenderness.  Abdominal:     General: There is no distension.     Palpations: Abdomen is soft.     Tenderness: There is no abdominal tenderness.  Genitourinary:    General: Normal vulva.     Exam position: Lithotomy position.     Urethra: No prolapse.     Vagina: Normal. No vaginal discharge or bleeding.     Cervix: Normal. No lesion.     Uterus: Normal. Not enlarged and  not tender.      Adnexa: Right adnexa normal and left adnexa normal.  Musculoskeletal:        General: Normal range of  motion.     Cervical back: Normal range of motion.  Lymphadenopathy:     Upper Body:     Right upper body: No axillary adenopathy.     Left upper body: No axillary adenopathy.     Lower Body: No right inguinal adenopathy. No left inguinal adenopathy.  Skin:    General: Skin is warm and dry.  Neurological:     General: No focal deficit present.     Mental Status: She is alert.  Psychiatric:        Mood and Affect: Mood normal.        Behavior: Behavior normal.      Assessment and Plan:        Well woman exam with routine gynecological exam Assessment & Plan: Cervical cancer screening performed according to ASCCP guidelines. Encouraged annual mammogram screening- due Colonoscopy duee  Labs and immunizations with her primary Encouraged safe sexual practices as indicated Encouraged healthy lifestyle practices with diet and exercise For patients under 50yo, I recommend 1000mg  calcium daily and 600IU of vitamin D  daily.    Cervical cancer screening -     Cytology - PAP  Screening for depression  Genital herpes simplex, unspecified site -     valACYclovir  HCl; Take one tablet daily, increase to bid x 3 days as needed  Dispense: 90 tablet; Refill: 4   Vera LULLA Pa, MD

## 2024-04-22 LAB — CYTOLOGY - PAP
Adequacy: ABSENT
Comment: NEGATIVE
Diagnosis: NEGATIVE
High risk HPV: NEGATIVE

## 2024-04-25 ENCOUNTER — Ambulatory Visit: Payer: Self-pay | Admitting: Obstetrics and Gynecology
